# Patient Record
Sex: Female | Born: 1995 | Race: White | Hispanic: No | Marital: Single | Smoking: Never smoker
Health system: Southern US, Community
[De-identification: ages and names within clinical notes are randomized; demographics above are authoritative.]

## PROBLEM LIST (undated history)

## (undated) ENCOUNTER — Inpatient Hospital Stay (HOSPITAL_COMMUNITY): Payer: Self-pay

## (undated) DIAGNOSIS — Z789 Other specified health status: Secondary | ICD-10-CM

## (undated) HISTORY — PX: NO PAST SURGERIES: SHX2092

## (undated) HISTORY — DX: Other specified health status: Z78.9

---
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
## 2014-09-14 NOTE — H&P (Signed)
L&D Evaluation:  History:  HPI 19 year old G1 P0 with EDC=07/02/2013 by a 13 wk 1 day ultrasound presents to L&D at 63 6/7 weeks with c/o contractions since 0400 this AM that woke her up from sleep. Since arrival to hospital contractions have become more regular and more intense. Fetal heart rate baseline has been in the 160s with multiple accels since arrival. Denies VB. Had some LOF x 2 weeks-mostly mucoid discharge. Baby active. Denies N/V. PNC at [facility] remarkable for anemia (was on FE for a short period of time and takes PNV occasionally). LABS: O POS, RI, Varicella NI, GBS negative.TDAP given 12/22.   Presents with contractions   Patient's Medical History anemia   Patient's Surgical History none   Medications Pre Natal Vitamins  Mucinex   Allergies Amoxicillin   Social History none   Family History Non-Contributory   ROS:  ROS see HPI   Exam:  Vital Signs Pulse 124 initially, now 110. BP stable   Urine Protein not completed   General no apparent distress   Mental Status clear   Chest clear   Heart tachycardia with Grade I-II systolic murmur  at LSB.   Abdomen gravid, tender with contractions   Estimated Fetal Weight 6 1/2#   Fetal Position cephalic   Edema no edema   Pelvic no external lesions, perineum appears dry, cx 1.5/60-70%/-1 with BBOW (no change in 2 +hrs   Mebranes Intact   FHT baseline 160s with accels   FHT Description moderate variability   Ucx q2-3 min apart   Skin dry   Impression:  Impression IUP at 38 6/7 weeks in early labor. Fetal and maternal tachycardia.   Plan:  Plan EFM/NST, monitor contractions and for cervical change   Comments Mild Fetal tachycardia-no evidence at this point for chorio-will get CBC and GC/Chlamydia and monitor closely. Start IV and bolus incase there is some dehydration issues. Will begin augmentation and move toward delivery if no progress by 1200. Maternal tachycardia-check CBC for anemia. Maybe increased  due to anxiety/pain   Electronic Signatures: Karene Fry (CNM)  (Signed 18-Feb-15 11:04)  Authored: L&D Evaluation   Last Updated: 18-Feb-15 11:04 by Karene Fry (CNM)

## 2015-08-01 ENCOUNTER — Encounter: Payer: Self-pay | Admitting: Family Medicine

## 2015-08-01 ENCOUNTER — Ambulatory Visit (INDEPENDENT_AMBULATORY_CARE_PROVIDER_SITE_OTHER): Payer: Managed Care, Other (non HMO) | Admitting: Family Medicine

## 2015-08-01 VITALS — BP 80/52 | HR 91 | Temp 98.3°F | Ht 62.5 in | Wt 110.0 lb

## 2015-08-01 DIAGNOSIS — Z3202 Encounter for pregnancy test, result negative: Secondary | ICD-10-CM | POA: Diagnosis not present

## 2015-08-01 DIAGNOSIS — Z3009 Encounter for other general counseling and advice on contraception: Secondary | ICD-10-CM

## 2015-08-01 DIAGNOSIS — Z309 Encounter for contraceptive management, unspecified: Secondary | ICD-10-CM | POA: Diagnosis not present

## 2015-08-01 LAB — POCT URINE PREGNANCY: PREG TEST UR: NEGATIVE

## 2015-08-01 MED ORDER — NORGESTIM-ETH ESTRAD TRIPHASIC 0.18/0.215/0.25 MG-35 MCG PO TABS: 1.0000 | ORAL_TABLET | Freq: Every day | ORAL | Status: DC

## 2015-08-01 NOTE — Progress Notes (Signed)
Dr. Frederico Hamman T. [place], MD, [facility] [place], [address] Phone: [phone] Fax: [phone]  08/01/2015  Patient: Holly Wilson, MRN: [id number], DOB: Mar 30, 1996, 20 y.o.  Primary Physician:  Owens Loffler, MD   Chief Complaint  Patient presents with  . Medication Management    Wants to change from Birth Control Patch to a Pill   Subjective:   Holly Wilson is a 20 y.o. very pleasant female patient who presents with the following:  [name] - 20 years old.   Not used anything since the baby.   Prior to then she was using the birth control patch, and now she would like to go back on birth control pills, which she took when she was younger.  She is otherwise healthy and not having any problems.  HER-21-year-old is doing well.  Past Medical History, Surgical History, Social History, Family History, Problem List, Medications, and Allergies have been reviewed and updated if relevant.  Patient Active Problem List   Diagnosis Date Noted  . Contraception management 10/21/2012    Past Medical History  Diagnosis Date  . No pertinent past medical history     Past Surgical History  Procedure Laterality Date  . No past surgeries      Social History   Social History  . Marital Status: Single    Spouse Name: N/A  . Number of Children: N/A  . Years of Education: N/A   Occupational History  . Not on file.   Social History Main Topics  . Smoking status: Never Smoker   . Smokeless tobacco: Never Used  . Alcohol Use: No  . Drug Use: No  . Sexual Activity: Not on file   Other Topics Concern  . Not on file   Social History Narrative   Sexually active   Mom- Blanca Friend to play volleyball, softball and go to [employer] actively involved    No family history on file.  Allergies  Allergen Reactions  . Amoxicillin     REACTION: rash    Medication list reviewed and updated in full in [facility].   GEN: No acute illnesses, no fevers, chills. GI: No n/v/d, eating normally Pulm: No SOB Interactive and getting along well at home.  Otherwise, ROS is as per the HPI.  Objective:   BP 80/52 mmHg  Pulse 91  Temp(Src) 98.3 F (36.8 C) (Oral)  Ht 5' 2.5" (1.588 m)  Wt 110 lb (49.896 kg)  BMI 19.79 kg/m2  LMP 07/22/2015  GEN: WDWN, NAD, Non-toxic, A & O x 3 HEENT: Atraumatic, Normocephalic. Neck supple. No masses, No LAD. Ears and Nose: No external deformity. CV: RRR, No M/G/R. No JVD. No thrill. No extra heart sounds. PULM: CTA B, no wheezes, crackles, rhonchi. No retractions. No resp. distress. No accessory muscle use. EXTR: No c/c/e NEURO Normal gait.  PSYCH: Normally interactive. Conversant. Not depressed or anxious appearing.  Calm demeanor.   Laboratory and Imaging Data: Results for orders placed or performed in visit on 08/01/15  POCT urine pregnancy  Result Value Ref Range   Preg Test, Ur Negative Negative     Assessment and Plan:   Birth control counseling  Encounter for pregnancy test with result negative - Plan: POCT urine pregnancy  Doing well with 20 yo at home.  Start OCP's  Follow-up: prn  Modified Medications   Modified Medication Previous Medication   NORGESTIMATE-ETHINYL ESTRADIOL TRIPHASIC (  TRI-SPRINTEC) 0.18/0.215/0.25 MG-35 MCG TABLET TRI-SPRINTEC 0.18/0.215/0.25 MG-35 MCG tablet      Take 1 tablet by mouth daily.    TAKE 1 TABLET BY MOUTH EVERY DAY   Orders Placed This Encounter  Procedures  . POCT urine pregnancy    Signed,  Spencer T. [place], MD   Patient's Medications  New Prescriptions   No medications on file  Previous Medications   No medications on file  Modified Medications   Modified Medication Previous Medication   NORGESTIMATE-ETHINYL ESTRADIOL TRIPHASIC (TRI-SPRINTEC) 0.18/0.215/0.25 MG-35 MCG TABLET TRI-SPRINTEC 0.18/0.215/0.25 MG-35 MCG tablet      Take 1 tablet by mouth daily.    TAKE 1 TABLET BY  MOUTH EVERY DAY  Discontinued Medications   NORELGESTROMIN-ETHINYL ESTRADIOL (ORTHO EVRA) 150-35 MCG/24HR TRANSDERMAL PATCH    One patch weekly, change every week for 3 weeks then off x 1 week. Start Day 1 of menstrual cycle.

## 2015-08-01 NOTE — Progress Notes (Signed)
Pre visit review using our clinic review tool, if applicable. No additional management support is needed unless otherwise documented below in the visit note. 

## 2015-08-02 ENCOUNTER — Encounter: Payer: Self-pay | Admitting: Family Medicine

## 2015-08-02 ENCOUNTER — Telehealth: Payer: Self-pay | Admitting: Family Medicine

## 2015-08-02 NOTE — Telephone Encounter (Signed)
Left message for Shallyn to return my call.

## 2015-08-02 NOTE — Telephone Encounter (Signed)
Holly Wilson notified by telephone to use back up protection, like condoms, for the first month after starting OCPs.  Patient states understanding.

## 2015-08-02 NOTE — Telephone Encounter (Signed)
Would you mind calling patient: I forgot to remind her to use backup protection / condoms for a full month after starting birth control pills since they take some time before the hormonal contraception starts working.  Thanks.  Electronically Signed  By: Owens Loffler, MD On: 08/02/2015 10:44 AM

## 2015-12-07 ENCOUNTER — Ambulatory Visit: Payer: Managed Care, Other (non HMO) | Admitting: Family Medicine

## 2015-12-07 DIAGNOSIS — Z0289 Encounter for other administrative examinations: Secondary | ICD-10-CM

## 2016-05-07 NOTE — L&D Delivery Note (Signed)
Delivery Note At 2:18 AM a viable female was delivered via Vaginal, Spontaneous Delivery (Presentation: OA).  APGAR: 8,9 ; weight  pending.  Cord clamped and cut after 1 min pulsing.  One loose cord around the right shoulder, easily reduced.  Placenta status: delivered intact with gentle traction.  Cord: not collected.  Anesthesia:  Epidural Episiotomy: None Lacerations:  1st degree periurethral, hemostatic. Not repaired.  Suture Repair: na Est. Blood Loss (mL):  100  Mom to postpartum.  Baby to Couplet care / Skin to Skin.  Mervyn Skeeters Aslyn Cottman CNM 01/15/2017, 2:59 AM

## 2016-05-17 ENCOUNTER — Encounter: Payer: Self-pay | Admitting: Family Medicine

## 2016-05-17 ENCOUNTER — Ambulatory Visit (INDEPENDENT_AMBULATORY_CARE_PROVIDER_SITE_OTHER): Payer: Managed Care, Other (non HMO) | Admitting: Family Medicine

## 2016-05-17 VITALS — HR 92 | Wt 113.0 lb

## 2016-05-17 DIAGNOSIS — Z3201 Encounter for pregnancy test, result positive: Secondary | ICD-10-CM

## 2016-05-17 DIAGNOSIS — N912 Amenorrhea, unspecified: Secondary | ICD-10-CM | POA: Diagnosis not present

## 2016-05-17 LAB — POCT URINE PREGNANCY: Preg Test, Ur: POSITIVE — AB

## 2016-05-17 NOTE — Patient Instructions (Signed)
Please start a prenatal vitamin  First Trimester of Pregnancy The first trimester of pregnancy is from week 1 until the end of week 12 (months 1 through 3). A week after a sperm fertilizes an egg, the egg will implant on the wall of the uterus. This embryo will begin to develop into a baby. Genes from you and your partner are forming the baby. The female genes determine whether the baby is a boy or a girl. At 6-8 weeks, the eyes and face are formed, and the heartbeat can be seen on ultrasound. At the end of 12 weeks, all the baby's organs are formed.  Now that you are pregnant, you will want to do everything you can to have a healthy baby. Two of the most important things are to get good prenatal care and to follow your health care provider's instructions. Prenatal care is all the medical care you receive before the baby's birth. This care will help prevent, find, and treat any problems during the pregnancy and childbirth. BODY CHANGES Your body goes through many changes during pregnancy. The changes vary from woman to woman.   You may gain or lose a couple of pounds at first.  You may feel sick to your stomach (nauseous) and throw up (vomit). If the vomiting is uncontrollable, call your health care provider.  You may tire easily.  You may develop headaches that can be relieved by medicines approved by your health care provider.  You may urinate more often. Painful urination may mean you have a bladder infection.  You may develop heartburn as a result of your pregnancy.  You may develop constipation because certain hormones are causing the muscles that push waste through your intestines to slow down.  You may develop hemorrhoids or swollen, bulging veins (varicose veins).  Your breasts may begin to grow larger and become tender. Your nipples may stick out more, and the tissue that surrounds them (areola) may become darker.  Your gums may bleed and may be sensitive to brushing and  flossing.  Dark spots or blotches (chloasma, mask of pregnancy) may develop on your face. This will likely fade after the baby is born.  Your menstrual periods will stop.  You may have a loss of appetite.  You may develop cravings for certain kinds of food.  You may have changes in your emotions from day to day, such as being excited to be pregnant or being concerned that something may go wrong with the pregnancy and baby.  You may have more vivid and strange dreams.  You may have changes in your hair. These can include thickening of your hair, rapid growth, and changes in texture. Some women also have hair loss during or after pregnancy, or hair that feels dry or thin. Your hair will most likely return to normal after your baby is born. WHAT TO EXPECT AT YOUR PRENATAL VISITS During a routine prenatal visit:  You will be weighed to make sure you and the baby are growing normally.  Your blood pressure will be taken.  Your abdomen will be measured to track your baby's growth.  The fetal heartbeat will be listened to starting around week 10 or 12 of your pregnancy.  Test results from any previous visits will be discussed. Your health care provider may ask you:  How you are feeling.  If you are feeling the baby move.  If you have had any abnormal symptoms, such as leaking fluid, bleeding, severe headaches, or abdominal cramping.  If  you are using any tobacco products, including cigarettes, chewing tobacco, and electronic cigarettes.  If you have any questions. Other tests that may be performed during your first trimester include:  Blood tests to find your blood type and to check for the presence of any previous infections. They will also be used to check for low iron levels (anemia) and Rh antibodies. Later in the pregnancy, blood tests for diabetes will be done along with other tests if problems develop.  Urine tests to check for infections, diabetes, or protein in the  urine.  An ultrasound to confirm the proper growth and development of the baby.  An amniocentesis to check for possible genetic problems.  Fetal screens for spina bifida and Down syndrome.  You may need other tests to make sure you and the baby are doing well.  HIV (human immunodeficiency virus) testing. Routine prenatal testing includes screening for HIV, unless you choose not to have this test. HOME CARE INSTRUCTIONS  Medicines   Follow your health care provider's instructions regarding medicine use. Specific medicines may be either safe or unsafe to take during pregnancy.  Take your prenatal vitamins as directed.  If you develop constipation, try taking a stool softener if your health care provider approves. Diet   Eat regular, well-balanced meals. Choose a variety of foods, such as meat or vegetable-based protein, fish, milk and low-fat dairy products, vegetables, fruits, and whole grain breads and cereals. Your health care provider will help you determine the amount of weight gain that is right for you.  Avoid raw meat and uncooked cheese. These carry germs that can cause birth defects in the baby.  Eating four or five small meals rather than three large meals a day may help relieve nausea and vomiting. If you start to feel nauseous, eating a few soda crackers can be helpful. Drinking liquids between meals instead of during meals also seems to help nausea and vomiting.  If you develop constipation, eat more high-fiber foods, such as fresh vegetables or fruit and whole grains. Drink enough fluids to keep your urine clear or pale yellow. Activity and Exercise   Exercise only as directed by your health care provider. Exercising will help you:  Control your weight.  Stay in shape.  Be prepared for labor and delivery.  Experiencing pain or cramping in the lower abdomen or low back is a good sign that you should stop exercising. Check with your health care provider before  continuing normal exercises.  Try to avoid standing for long periods of time. Move your legs often if you must stand in one place for a long time.  Avoid heavy lifting.  Wear low-heeled shoes, and practice good posture.  You may continue to have sex unless your health care provider directs you otherwise. Relief of Pain or Discomfort   Wear a good support bra for breast tenderness.   Take warm sitz baths to soothe any pain or discomfort caused by hemorrhoids. Use hemorrhoid cream if your health care provider approves.   Rest with your legs elevated if you have leg cramps or low back pain.  If you develop varicose veins in your legs, wear support hose. Elevate your feet for 15 minutes, 3-4 times a day. Limit salt in your diet. Prenatal Care   Schedule your prenatal visits by the twelfth week of pregnancy. They are usually scheduled monthly at first, then more often in the last 2 months before delivery.  Write down your questions. Take them to your prenatal  visits.  Keep all your prenatal visits as directed by your health care provider. Safety   Wear your seat belt at all times when driving.  Make a list of emergency phone numbers, including numbers for family, friends, the hospital, and police and fire departments. General Tips   Ask your health care provider for a referral to a local prenatal education class. Begin classes no later than at the beginning of month 6 of your pregnancy.  Ask for help if you have counseling or nutritional needs during pregnancy. Your health care provider can offer advice or refer you to specialists for help with various needs.  Do not use hot tubs, steam rooms, or saunas.  Do not douche or use tampons or scented sanitary pads.  Do not cross your legs for long periods of time.  Avoid cat litter boxes and soil used by cats. These carry germs that can cause birth defects in the baby and possibly loss of the fetus by miscarriage or  stillbirth.  Avoid all smoking, herbs, alcohol, and medicines not prescribed by your health care provider. Chemicals in these affect the formation and growth of the baby.  Do not use any tobacco products, including cigarettes, chewing tobacco, and electronic cigarettes. If you need help quitting, ask your health care provider. You may receive counseling support and other resources to help you quit.  Schedule a dentist appointment. At home, brush your teeth with a soft toothbrush and be gentle when you floss. SEEK MEDICAL CARE IF:   You have dizziness.  You have mild pelvic cramps, pelvic pressure, or nagging pain in the abdominal area.  You have persistent nausea, vomiting, or diarrhea.  You have a bad smelling vaginal discharge.  You have pain with urination.  You notice increased swelling in your face, hands, legs, or ankles. SEEK IMMEDIATE MEDICAL CARE IF:   You have a fever.  You are leaking fluid from your vagina.  You have spotting or bleeding from your vagina.  You have severe abdominal cramping or pain.  You have rapid weight gain or loss.  You vomit blood or material that looks like coffee grounds.  You are exposed to [place] measles and have never had them.  You are exposed to fifth disease or chickenpox.  You develop a severe headache.  You have shortness of breath.  You have any kind of trauma, such as from a fall or a car accident. This information is not intended to replace advice given to you by your health care provider. Make sure you discuss any questions you have with your health care provider. Document Released: 04/17/2001 Document Revised: 05/14/2014 Document Reviewed: 03/03/2013 Elsevier Interactive Patient Education  2017 [employer].

## 2016-05-17 NOTE — Progress Notes (Signed)
   Subjective:    Patient ID: Holly Wilson, female    DOB: August 19, 1995, 21 y.o.   MRN: [id number]  HPI This is a 21 yo female who presents today with amenorrhea and positive home pregnancy test. LMP 03/31/16. Has had some breast tenderness and bloating. No nausea/vomiting or fatigue. Had stopped OCPs several months ago because they made her feel more hormonal. Does not take any medications, no alcohol or drug use. Has 63 yo daughter at home.   Past Medical History:  Diagnosis Date  . No pertinent past medical history    Past Surgical History:  Procedure Laterality Date  . NO PAST SURGERIES     No family history on file. Social History  Substance Use Topics  . Smoking status: Never Smoker  . Smokeless tobacco: Never Used  . Alcohol use No      Review of Systems Per HPI    Objective:   Physical Exam Physical Exam  Vitals reviewed. Constitutional: Oriented to person, place, and time. Appears well-developed and well-nourished.  HENT:  Head: Normocephalic and atraumatic.  Eyes: Conjunctivae are normal.  Neck: Normal range of motion. Neck supple.  Cardiovascular: Normal rate.   Pulmonary/Chest: Effort normal.  Musculoskeletal: Normal range of motion.  Neurological: Alert and oriented to person, place, and time.  Skin: Skin is warm and dry.  Psychiatric: Normal mood and affect. Behavior is normal. Judgment and thought content normal.   Pulse 92   Wt 113 lb (51.3 kg)   LMP 03/31/2016   SpO2 99%   BMI 20.34 kg/m  Wt Readings from Last 3 Encounters:  05/17/16 113 lb (51.3 kg)  08/01/15 110 lb (49.9 kg)  11/24/12 115 lb (52.2 kg) (30 %, Z= -0.53)*   * Growth percentiles are based on CDC 2-20 Years data.   Results for orders placed or performed in visit on 05/17/16  POCT urine pregnancy  Result Value Ref Range   Preg Test, Ur Positive (A) Negative       Assessment & Plan:  1. Amenorrhea - POCT urine pregnancy  2. Positive pregnancy test - instructed her to start  prenatal vitamin - provided written and verbal information regarding first trimester of pregnancy - Ambulatory referral to Obstetrics / Gynecology  Clarene Reamer, FNP-BC  [facility] at [facility], [place]  05/17/2016 9:24 AM

## 2016-06-05 ENCOUNTER — Telehealth: Payer: Self-pay

## 2016-06-05 NOTE — Telephone Encounter (Signed)
Pt left v/m; pt seen 05/17/16 and is pregnant; pt has been vomiting daily for 2 1/2 weeks and pt wants to know what she can take for vomiting. Pt has appt with OB on 06/18/16. Pt request cb. [facility].

## 2016-06-06 MED ORDER — DOXYLAMINE-PYRIDOXINE 10-10 MG PO TBEC
DELAYED_RELEASE_TABLET | ORAL | 1 refills | Status: DC
Start: 2016-06-06 — End: 2016-06-18

## 2016-06-06 NOTE — Telephone Encounter (Signed)
1st - congratulations!  Most of the OB's are using Diclegis now for nausea in pregnancy.  Please call in:  Diclegis 1 tab po QAM and 1-2 tabs po QHS prn nausea.  Disp: 50 Refills: 1  Hope she feels better

## 2016-06-06 NOTE — Telephone Encounter (Signed)
Rx sent is as instructed by Dr. Lorelei Pont.  Left message for [place] to return my call.

## 2016-06-06 NOTE — Telephone Encounter (Signed)
Left message for Holly Wilson that a prescription for nausea has been sent into her pharmacy.  Advised to call back if she has any questions.

## 2016-06-18 ENCOUNTER — Other Ambulatory Visit (HOSPITAL_COMMUNITY)
Admission: RE | Admit: 2016-06-18 | Discharge: 2016-06-18 | Disposition: A | Discharge status: Home / Self Care | Payer: Managed Care, Other (non HMO) | Source: Ambulatory Visit | Attending: Obstetrics & Gynecology | Admitting: Obstetrics & Gynecology

## 2016-06-18 ENCOUNTER — Encounter: Payer: Self-pay | Admitting: Obstetrics & Gynecology

## 2016-06-18 ENCOUNTER — Ambulatory Visit (INDEPENDENT_AMBULATORY_CARE_PROVIDER_SITE_OTHER): Payer: Managed Care, Other (non HMO) | Admitting: Obstetrics & Gynecology

## 2016-06-18 VITALS — BP 95/62 | HR 109 | Ht 63.0 in | Wt 116.0 lb

## 2016-06-18 DIAGNOSIS — Z3491 Encounter for supervision of normal pregnancy, unspecified, first trimester: Secondary | ICD-10-CM | POA: Diagnosis not present

## 2016-06-18 DIAGNOSIS — Z113 Encounter for screening for infections with a predominantly sexual mode of transmission: Secondary | ICD-10-CM | POA: Diagnosis not present

## 2016-06-18 DIAGNOSIS — Z3689 Encounter for other specified antenatal screening: Secondary | ICD-10-CM

## 2016-06-18 DIAGNOSIS — Z124 Encounter for screening for malignant neoplasm of cervix: Secondary | ICD-10-CM

## 2016-06-18 DIAGNOSIS — Z3490 Encounter for supervision of normal pregnancy, unspecified, unspecified trimester: Secondary | ICD-10-CM

## 2016-06-18 DIAGNOSIS — Z01419 Encounter for gynecological examination (general) (routine) without abnormal findings: Secondary | ICD-10-CM | POA: Diagnosis present

## 2016-06-18 MED ORDER — DOXYLAMINE-PYRIDOXINE 10-10 MG PO TBEC: DELAYED_RELEASE_TABLET | ORAL | 2 refills | Status: DC

## 2016-06-18 NOTE — Progress Notes (Signed)
[place] Bedside shows single IUP with FHR 180bpm and CRL measuring [redacted]w[redacted]d

## 2016-06-18 NOTE — Progress Notes (Signed)
Subjective:   Lareina Oquin is a 21 y.o. G2P1001 at [redacted]w[redacted]d by early ultrasound not c/w LMP being seen today for her first obstetrical visit.  Her obstetrical history is significant for term SVD. Patient does intend to breast feed. Pregnancy history fully reviewed.  Patient reports nausea and vomiting.  HISTORY: Obstetric History   G2   P1   T1   P0   A0   L1    SAB0   TAB0   Ectopic0   Multiple0   Live Births1     # Outcome Date GA Lbr Len/2nd Weight Sex Delivery Anes PTL Lv  2 Current           1 Term 06/25/13 [redacted]w[redacted]d   F Vag-Spont EPI  LIV    Obstetric Comments  Fetal and maternal tachycardia during labor   Past Medical History:  Diagnosis Date  . No pertinent past medical history    Past Surgical History:  Procedure Laterality Date  . NO PAST SURGERIES     Family History  Problem Relation Age of Onset  . Breast cancer Maternal Grandmother 69   Social History  Substance Use Topics  . Smoking status: Never Smoker  . Smokeless tobacco: Never Used  . Alcohol use No   Allergies  Allergen Reactions  . Amoxicillin     REACTION: rash   No current outpatient prescriptions on file prior to visit.   No current facility-administered medications on file prior to visit.      Exam   Vitals:   06/18/16 1324 06/18/16 1329  BP: 95/62   Pulse: (!) 109   Weight: 116 lb (52.6 kg)   Height:  5\' 3"  (1.6 m)   Fetal Heart Rate (bpm): 175US  Uterus:     Pelvic Exam: Perineum: no hemorrhoids, normal perineum   Vulva: normal external genitalia, no lesions   Vagina:  normal mucosa, normal discharge   Cervix: no lesions and normal, pap smear done.    Adnexa: normal adnexa and no mass, fullness, tenderness   Bony Pelvis: average  System: General: well-developed, well-nourished female in no acute distress   Breast:  normal appearance, no masses or tenderness   Skin: normal coloration and turgor, no rashes   Neurologic: oriented, normal, negative, normal mood   Extremities:  normal strength, tone, and muscle mass, ROM of all joints is normal   HEENT PERRLA, extraocular movement intact and sclera clear, anicteric   Mouth/Teeth mucous membranes moist, pharynx normal without lesions and dental hygiene good   Neck supple and no masses   Cardiovascular: regular rate and rhythm   Respiratory:  no respiratory distress, normal breath sounds   Abdomen: soft, non-tender; bowel sounds normal; no masses,  no organomegaly     Assessment:   Pregnancy: G2P1001 Patient Active Problem List   Diagnosis Date Noted  . Supervision of normal pregnancy, antepartum 06/18/2016     Plan:  1. Encounter for supervision of normal pregnancy, antepartum, unspecified gravidity Patient enrolled in 58.  Diclegis given for nausea. - Culture, OB Urine - GC/Chlamydia probe amp ([place])not at [facility] - Obstetric Panel, Including HIV - Cytology - PAP - [place] bedside; Future - [place] MFM Fetal Nuchal Translucency; Future - Doxylamine-Pyridoxine (DICLEGIS) 10-10 MG TBEC; Take 2 tab qhs, add 2 midday if Sx persist  Dispense: 60 tablet; Refill: 2 - ToxASSURE Select 13 (MW), Urine  Initial labs drawn. Continue prenatal vitamins. Genetic Screening discussed, First trimester screen: ordered. Ultrasound discussed; fetal anatomic  survey: to be ordered later. Problem list reviewed and updated. The nature of [facility] with multiple MDs and other Advanced Practice Providers was explained to patient; also emphasized that residents, students are part of our team. Routine obstetric precautions reviewed. Return in about 3 weeks (around 07/09/2016) for OB Visit (Babyscripts 12 weeks).     Verita Schneiders, MD, [place] Attending [place], [facility] for [employer], [place]

## 2016-06-18 NOTE — Patient Instructions (Signed)
First Trimester of Pregnancy The first trimester of pregnancy is from week 1 until the end of week 12 (months 1 through 3). A week after a sperm fertilizes an egg, the egg will implant on the wall of the uterus. This embryo will begin to develop into a baby. Genes from you and your partner are forming the baby. The female genes determine whether the baby is a boy or a girl. At 6-8 weeks, the eyes and face are formed, and the heartbeat can be seen on ultrasound. At the end of 12 weeks, all the baby's organs are formed.  Now that you are pregnant, you will want to do everything you can to have a healthy baby. Two of the most important things are to get good prenatal care and to follow your health care provider's instructions. Prenatal care is all the medical care you receive before the baby's birth. This care will help prevent, find, and treat any problems during the pregnancy and childbirth. BODY CHANGES Your body goes through many changes during pregnancy. The changes vary from woman to woman.   You may gain or lose a couple of pounds at first.  You may feel sick to your stomach (nauseous) and throw up (vomit). If the vomiting is uncontrollable, call your health care provider.  You may tire easily.  You may develop headaches that can be relieved by medicines approved by your health care provider.  You may urinate more often. Painful urination may mean you have a bladder infection.  You may develop heartburn as a result of your pregnancy.  You may develop constipation because certain hormones are causing the muscles that push waste through your intestines to slow down.  You may develop hemorrhoids or swollen, bulging veins (varicose veins).  Your breasts may begin to grow larger and become tender. Your nipples may stick out more, and the tissue that surrounds them (areola) may become darker.  Your gums may bleed and may be sensitive to brushing and flossing.  Dark spots or blotches (chloasma,  mask of pregnancy) may develop on your face. This will likely fade after the baby is born.  Your menstrual periods will stop.  You may have a loss of appetite.  You may develop cravings for certain kinds of food.  You may have changes in your emotions from day to day, such as being excited to be pregnant or being concerned that something may go wrong with the pregnancy and baby.  You may have more vivid and strange dreams.  You may have changes in your hair. These can include thickening of your hair, rapid growth, and changes in texture. Some women also have hair loss during or after pregnancy, or hair that feels dry or thin. Your hair will most likely return to normal after your baby is born. WHAT TO EXPECT AT YOUR PRENATAL VISITS During a routine prenatal visit:  You will be weighed to make sure you and the baby are growing normally.  Your blood pressure will be taken.  Your abdomen will be measured to track your baby's growth.  The fetal heartbeat will be listened to starting around week 10 or 12 of your pregnancy.  Test results from any previous visits will be discussed. Your health care provider may ask you:  How you are feeling.  If you are feeling the baby move.  If you have had any abnormal symptoms, such as leaking fluid, bleeding, severe headaches, or abdominal cramping.  If you are using any tobacco products,   including cigarettes, chewing tobacco, and electronic cigarettes.  If you have any questions. Other tests that may be performed during your first trimester include:  Blood tests to find your blood type and to check for the presence of any previous infections. They will also be used to check for low iron levels (anemia) and Rh antibodies. Later in the pregnancy, blood tests for diabetes will be done along with other tests if problems develop.  Urine tests to check for infections, diabetes, or protein in the urine.  An ultrasound to confirm the proper growth  and development of the baby.  An amniocentesis to check for possible genetic problems.  Fetal screens for spina bifida and Down syndrome.  You may need other tests to make sure you and the baby are doing well.  HIV (human immunodeficiency virus) testing. Routine prenatal testing includes screening for HIV, unless you choose not to have this test. HOME CARE INSTRUCTIONS  Medicines   Follow your health care provider's instructions regarding medicine use. Specific medicines may be either safe or unsafe to take during pregnancy.  Take your prenatal vitamins as directed.  If you develop constipation, try taking a stool softener if your health care provider approves. Diet   Eat regular, well-balanced meals. Choose a variety of foods, such as meat or vegetable-based protein, fish, milk and low-fat dairy products, vegetables, fruits, and whole grain breads and cereals. Your health care provider will help you determine the amount of weight gain that is right for you.  Avoid raw meat and uncooked cheese. These carry germs that can cause birth defects in the baby.  Eating four or five small meals rather than three large meals a day may help relieve nausea and vomiting. If you start to feel nauseous, eating a few soda crackers can be helpful. Drinking liquids between meals instead of during meals also seems to help nausea and vomiting.  If you develop constipation, eat more high-fiber foods, such as fresh vegetables or fruit and whole grains. Drink enough fluids to keep your urine clear or pale yellow. Activity and Exercise   Exercise only as directed by your health care provider. Exercising will help you:  Control your weight.  Stay in shape.  Be prepared for labor and delivery.  Experiencing pain or cramping in the lower abdomen or low back is a good sign that you should stop exercising. Check with your health care provider before continuing normal exercises.  Try to avoid standing for  long periods of time. Move your legs often if you must stand in one place for a long time.  Avoid heavy lifting.  Wear low-heeled shoes, and practice good posture.  You may continue to have sex unless your health care provider directs you otherwise. Relief of Pain or Discomfort   Wear a good support bra for breast tenderness.   Take warm sitz baths to soothe any pain or discomfort caused by hemorrhoids. Use hemorrhoid cream if your health care provider approves.   Rest with your legs elevated if you have leg cramps or low back pain.  If you develop varicose veins in your legs, wear support hose. Elevate your feet for 15 minutes, 3-4 times a day. Limit salt in your diet. Prenatal Care   Schedule your prenatal visits by the twelfth week of pregnancy. They are usually scheduled monthly at first, then more often in the last 2 months before delivery.  Write down your questions. Take them to your prenatal visits.  Keep all your prenatal  legs, wear support hose. Elevate your feet for 15 minutes, 3-4 times a day. Limit salt in your diet.  Prenatal Care  · Schedule your prenatal visits by the twelfth week of pregnancy. They are usually scheduled monthly at first, then more often in the last 2 months before delivery.  · Write down your questions. Take them to your prenatal visits.  · Keep all your prenatal visits as directed by your health care provider.  Safety  · Wear your seat belt at all times when driving.  · Make a list of emergency phone numbers, including numbers for family, friends, the hospital, and police and fire departments.  General Tips  · Ask your health care provider for a referral to a local prenatal education class. Begin classes no later than at the beginning of month 6 of your pregnancy.  · Ask for help if you have counseling or nutritional needs during pregnancy. Your health care provider can offer advice or refer you to specialists for help with various needs.  · Do not use hot tubs, steam rooms, or saunas.  · Do not douche or use tampons or scented sanitary pads.  · Do not cross your legs for long periods of time.  · Avoid cat litter boxes and soil used by cats. These carry germs that can cause birth defects in the baby and possibly loss of the fetus by miscarriage or stillbirth.   · Avoid all smoking, herbs, alcohol, and medicines not prescribed by your health care provider. Chemicals in these affect the formation and growth of the baby.  · Do not use any tobacco products, including cigarettes, chewing tobacco, and electronic cigarettes. If you need help quitting, ask your health care provider. You may receive counseling support and other resources to help you quit.  · Schedule a dentist appointment. At home, brush your teeth with a soft toothbrush and be gentle when you floss.  SEEK MEDICAL CARE IF:   · You have dizziness.  · You have mild pelvic cramps, pelvic pressure, or nagging pain in the abdominal area.  · You have persistent nausea, vomiting, or diarrhea.  · You have a bad smelling vaginal discharge.  · You have pain with urination.  · You notice increased swelling in your face, hands, legs, or ankles.  SEEK IMMEDIATE MEDICAL CARE IF:   · You have a fever.  · You are leaking fluid from your vagina.  · You have spotting or bleeding from your vagina.  · You have severe abdominal cramping or pain.  · You have rapid weight gain or loss.  · You vomit blood or material that looks like coffee grounds.  · You are exposed to German measles and have never had them.  · You are exposed to fifth disease or chickenpox.  · You develop a severe headache.  · You have shortness of breath.  · You have any kind of trauma, such as from a fall or a car accident.     This information is not intended to replace advice given to you by your health care provider. Make sure you discuss any questions you have with your health care provider.     Document Released: 04/17/2001 Document Revised: 05/14/2014 Document Reviewed: 03/03/2013  Elsevier Interactive Patient Education ©2017 [employer]

## 2016-06-19 LAB — OBSTETRIC PANEL, INCLUDING HIV
HIV Screen 4th Generation wRfx: NONREACTIVE
Hepatitis B Surface Ag: NEGATIVE
RPR Ser Ql: NONREACTIVE
Rubella Antibodies, IGG: 11.2 index (ref >0.99)

## 2016-06-19 LAB — GC/CHLAMYDIA PROBE AMP (~~LOC~~) NOT AT ARMC
Chlamydia: NEGATIVE
NEISSERIA GONORRHEA: NEGATIVE

## 2016-06-19 LAB — CYTOLOGY - PAP: DIAGNOSIS: NEGATIVE

## 2016-06-26 LAB — CULTURE, OB URINE

## 2016-06-26 LAB — TOXASSURE SELECT 13 (MW), URINE

## 2016-06-26 LAB — URINE CULTURE, OB REFLEX

## 2016-06-28 NOTE — Progress Notes (Signed)
Lavender tops were not sent to lab, will repeat her labs at next visit. Thank you.

## 2016-07-05 ENCOUNTER — Encounter (HOSPITAL_COMMUNITY): Payer: Self-pay | Admitting: Obstetrics & Gynecology

## 2016-07-09 ENCOUNTER — Encounter: Payer: Managed Care, Other (non HMO) | Admitting: Obstetrics & Gynecology

## 2016-07-16 ENCOUNTER — Other Ambulatory Visit: Payer: Self-pay

## 2016-07-16 ENCOUNTER — Ambulatory Visit (HOSPITAL_COMMUNITY)
Admission: RE | Admit: 2016-07-16 | Discharge: 2016-07-16 | Disposition: A | Discharge status: Home / Self Care | Payer: Managed Care, Other (non HMO) | Source: Ambulatory Visit | Attending: Obstetrics & Gynecology | Admitting: Obstetrics & Gynecology

## 2016-07-16 ENCOUNTER — Encounter (HOSPITAL_COMMUNITY): Payer: Self-pay

## 2016-07-16 DIAGNOSIS — Z3A13 13 weeks gestation of pregnancy: Secondary | ICD-10-CM | POA: Diagnosis not present

## 2016-07-16 DIAGNOSIS — Z3682 Encounter for antenatal screening for nuchal translucency: Secondary | ICD-10-CM | POA: Diagnosis not present

## 2016-07-16 DIAGNOSIS — Z3490 Encounter for supervision of normal pregnancy, unspecified, unspecified trimester: Secondary | ICD-10-CM

## 2016-07-19 ENCOUNTER — Emergency Department
Admission: EM | Admit: 2016-07-19 | Discharge: 2016-07-19 | Disposition: A | Discharge status: Home / Self Care | Payer: Managed Care, Other (non HMO) | Attending: Emergency Medicine | Admitting: Emergency Medicine

## 2016-07-19 ENCOUNTER — Encounter: Payer: Self-pay | Admitting: Emergency Medicine

## 2016-07-19 DIAGNOSIS — R197 Diarrhea, unspecified: Secondary | ICD-10-CM | POA: Insufficient documentation

## 2016-07-19 DIAGNOSIS — O219 Vomiting of pregnancy, unspecified: Secondary | ICD-10-CM | POA: Diagnosis not present

## 2016-07-19 DIAGNOSIS — Z3A13 13 weeks gestation of pregnancy: Secondary | ICD-10-CM | POA: Insufficient documentation

## 2016-07-19 DIAGNOSIS — R112 Nausea with vomiting, unspecified: Secondary | ICD-10-CM

## 2016-07-19 DIAGNOSIS — O26891 Other specified pregnancy related conditions, first trimester: Secondary | ICD-10-CM | POA: Diagnosis present

## 2016-07-19 LAB — COMPREHENSIVE METABOLIC PANEL
ALBUMIN: 4.5 g/dL (ref 3.5–5.0)
ALK PHOS: 55 U/L (ref 38–126)
ALT: 13 U/L — ABNORMAL LOW (ref 14–54)
ANION GAP: 11 (ref 5–15)
AST: 21 U/L (ref 15–41)
BUN: 11 mg/dL (ref 6–20)
CALCIUM: 9.6 mg/dL (ref 8.9–10.3)
CO2: 22 mmol/L (ref 22–32)
Chloride: 102 mmol/L (ref 101–111)
Creatinine, Ser: 0.52 mg/dL (ref 0.44–1.00)
GFR calc Af Amer: >60 mL/min (ref >60)
GFR calc non Af Amer: >60 mL/min (ref >60)
GLUCOSE: 91 mg/dL (ref 65–99)
POTASSIUM: 3.6 mmol/L (ref 3.5–5.1)
SODIUM: 135 mmol/L (ref 135–145)
Total Bilirubin: 0.6 mg/dL (ref 0.3–1.2)
Total Protein: 7.8 g/dL (ref 6.5–8.1)

## 2016-07-19 LAB — HCG, QUANTITATIVE, PREGNANCY: hCG, Beta Chain, Quant, S: 87058 m[IU]/mL — ABNORMAL HIGH (ref <5)

## 2016-07-19 LAB — CBC
HEMATOCRIT: 39.2 % (ref 35.0–47.0)
HEMOGLOBIN: 13.7 g/dL (ref 12.0–16.0)
MCH: 33.2 pg (ref 26.0–34.0)
MCHC: 34.9 g/dL (ref 32.0–36.0)
MCV: 95 fL (ref 80.0–100.0)
Platelets: 208 10*3/uL (ref 150–440)
RBC: 4.13 MIL/uL (ref 3.80–5.20)
RDW: 13.5 % (ref 11.5–14.5)
WBC: 10.9 10*3/uL (ref 3.6–11.0)

## 2016-07-19 LAB — LIPASE, BLOOD: Lipase: 19 U/L (ref 11–51)

## 2016-07-19 MED ORDER — ONDANSETRON 4 MG PO TBDP
4.0000 mg | ORAL_TABLET | Freq: Once | ORAL | Status: AC
  Administered 2016-07-19: 4 mg via ORAL

## 2016-07-19 MED ORDER — ONDANSETRON 4 MG PO TBDP: 4.0000 mg | ORAL_TABLET | Freq: Three times a day (TID) | ORAL | 0 refills | Status: DC | PRN

## 2016-07-19 MED ORDER — ONDANSETRON HCL 4 MG/2ML IJ SOLN
4.0000 mg | Freq: Once | INTRAMUSCULAR | Status: AC
  Administered 2016-07-19: 4 mg via INTRAVENOUS
  Filled 2016-07-19: qty 2

## 2016-07-19 MED ORDER — ONDANSETRON HCL 4 MG PO TABS: 4.0000 mg | ORAL_TABLET | Freq: Once | ORAL | Status: DC

## 2016-07-19 MED ORDER — SODIUM CHLORIDE 0.9 % IV BOLUS (SEPSIS)
1000.0000 mL | Freq: Once | INTRAVENOUS | Status: AC
  Administered 2016-07-19: 1000 mL via INTRAVENOUS

## 2016-07-19 MED ORDER — ONDANSETRON 4 MG PO TBDP
ORAL_TABLET | ORAL | Status: AC
  Administered 2016-07-19: 4 mg via ORAL
  Filled 2016-07-19: qty 1

## 2016-07-19 NOTE — ED Provider Notes (Signed)
[facility] Emergency Department Provider Note  Time seen: 9:36 PM  I have reviewed the triage vital signs and the nursing notes.   HISTORY  Chief Complaint Emesis and Diarrhea    HPI Holly Wilson is a 21 y.o. female G2 P1 [redacted] weeks pregnant who presents the emergency department nausea or vomiting. According to the patient since this morning she isn't very nauseated and has not been able to keep anything down due to vomiting. Just a states a few episodes of diarrhea intermittently over the last few days. Patient states a history of morning sickness for which she is prescribed diclegis by her OB/GYN. Patient states she tried to take it but is vomiting and cannot keep anything down. Denies any abdominal pain. Denies any cramping. Denies vaginal bleeding or discharge. Denies any history of morning sickness with her prior pregnancy.  Past Medical History:  Diagnosis Date  . No pertinent past medical history     Patient Active Problem List   Diagnosis Date Noted  . Supervision of normal pregnancy, antepartum 06/18/2016    Past Surgical History:  Procedure Laterality Date  . NO PAST SURGERIES      Prior to Admission medications   Medication Sig Start Date End Date Taking? Authorizing Provider  Doxylamine-Pyridoxine (DICLEGIS) 10-10 MG TBEC Take 2 tab qhs, add 2 midday if Sx persist 06/18/16   Osborne Oman, MD  Prenatal Vit-Fe Fumarate-FA (PRENATAL MULTIVITAMIN) TABS tablet Take 1 tablet by mouth daily at 12 noon.    Historical Provider, MD    Allergies  Allergen Reactions  . Amoxicillin     REACTION: rash    Family History  Problem Relation Age of Onset  . Breast cancer Maternal Grandmother 52    Social History Social History  Substance Use Topics  . Smoking status: Never Smoker  . Smokeless tobacco: Never Used  . Alcohol use No    Review of Systems Constitutional: Negative for fever. Cardiovascular: Negative for chest pain. Respiratory:  Negative for shortness of breath. Gastrointestinal: Negative for abdominal pain. Positive for vomiting. Positive for diarrhea. Genitourinary: Negative for dysuria Neurological: Negative for headache 10-point ROS otherwise negative.  ____________________________________________   PHYSICAL EXAM:  VITAL SIGNS: ED Triage Vitals  Enc Vitals Group     BP 07/19/16 1944 139/72     Pulse Rate 07/19/16 1944 (!) 107     Resp 07/19/16 1944 18     Temp 07/19/16 1944 98.8 F (37.1 C)     Temp Source 07/19/16 1944 Oral     SpO2 07/19/16 1944 100 %     Weight 07/19/16 1944 114 lb (51.7 kg)     Height 07/19/16 1944 5\' 3"  (1.6 m)     Head Circumference --      Peak Flow --      Pain Score 07/19/16 1946 0     Pain Loc --      Pain Edu? --      Excl. in [place]? --     Constitutional: Alert and oriented. Actively vomiting in the emergency department. Eyes: Normal exam ENT   Head: Normocephalic and atraumatic.   Mouth/Throat: Mucous membranes are moist. Cardiovascular: Normal rate, regular rhythm. No murmur Respiratory: Normal respiratory effort without tachypnea nor retractions. Breath sounds are clear  Gastrointestinal: Soft and nontender. No distention.   Musculoskeletal: Nontender with normal range of motion in all extremities.  Neurologic:  Normal speech and language. No gross focal neurologic deficits  Skin:  Skin is warm,  dry and intact.  Psychiatric: Mood and affect are normal.     INITIAL IMPRESSION / ASSESSMENT AND PLAN / ED COURSE  Pertinent labs & imaging results that were available during my care of the patient were reviewed by me and considered in my medical decision making (see chart for details).  Patient presents to the emergency department nausea and vomiting. Patient states she has had issues with nausea and vomiting throughout the pregnancy, her symptoms are much worse today. Patient's first and [redacted] weeks pregnant. Denies any abdominal pain and vaginal bleeding or  discharge. Patient's labs are largely within normal limits including normal white blood cell count. Normal anion gap. Urinalysis is pending. Beta hCG is appropriately elevated. Patient states intermittent diarrhea throughout the pregnancy as well, it is unclear if this is due to an acute gastroenteritis versus more likely hyperemesis gravidarum. I discussed the pros and cons of Zofran, including cleft lip/cleft palate concerns. Patient is agreeable to use Zofran.  Patient states she is feeling much better after Zofran. Patient able to tolerate water without issue. We will discharge with ODT Zofran. Patient will follow up with her OB/GYN. ____________________________________________   FINAL CLINICAL IMPRESSION(S) / ED DIAGNOSES  Nausea vomiting diarrhea    Harvest Dark, MD 07/19/16 2234

## 2016-07-19 NOTE — ED Triage Notes (Signed)
PT ambulatory to triage in NAD, report n/v/d x 9 weeks, OB aware and currently taking diclegis w/o relief.  G2P1.  Pt denies morning sickness w/ previous pregnancy

## 2016-07-19 NOTE — ED Notes (Signed)
Pt able to tolerate PO fluids at this time.  

## 2016-07-20 ENCOUNTER — Telehealth: Payer: Self-pay | Admitting: Family Medicine

## 2016-07-20 ENCOUNTER — Telehealth: Payer: Self-pay

## 2016-07-20 NOTE — Telephone Encounter (Signed)
I am happy to see her - but better if GYN sees her.

## 2016-07-20 NOTE — Telephone Encounter (Signed)
Left detailed msg on VM per HIPAA  

## 2016-07-20 NOTE — Telephone Encounter (Signed)
Pt has appt with Holly Wilson on Monday--- I called pt [facility] to see if she has a GYN or have called them to see if she may need to f/u with them as well---please advise

## 2016-07-20 NOTE — Telephone Encounter (Signed)
[place] [facility]  Patient Name: Holly Wilson  DOB: March 02, 1996    Initial Comment Caller States she has been vomiting, she is [redacted] weeks pregnant    Nurse Assessment  Nurse: Wynetta Emery, RN, Baker Janus Date/Time (Eastern Time): 07/20/2016 8:38:51 AM  Confirm and document reason for call. If symptomatic, describe symptoms. ---[name] is is 13 weeks 4 days has been vomiting for 8 to 9 weeks but yesterday could not keep anything down threw up everything yesterday usually could keep something down. seen in ED last evening and given IV fluids and zofran.  Does the patient have any new or worsening symptoms? ---Yes  Will a triage be completed? ---Yes  Related visit to physician within the last 2 weeks? ---No  Does the PT have any chronic conditions? (i.e. diabetes, asthma, etc.) ---No  Is the patient pregnant or possibly pregnant? (Ask all females between the ages of 62-55) ---Yes  How many weeks gestation? ---13 weeks 4 weeks  What is the estimated delivery date? ---2017-01-21  Total number of pregnancies including current? ---2  Number of live births? ---1  Have you felt decreased fetal movement? ---Early Pregnancy - No Fetal Movement Felt Yet  Is this a behavioral health or substance abuse call? ---No     Guidelines    Guideline Title Affirmed Question Affirmed Notes  Pregnancy - Morning Sickness (Nausea and Vomiting of Pregnancy) Weight loss > 5 pounds (2.5 kg) in last 2 weeks    Final Disposition User   See Physician within 4 Hours (or PCP triage) Wynetta Emery, RN, Baker Janus    Comments  NOTE APPT GIVEN FOR 3/19-2018 815 am with Shary Key, NP for possible hyperemesis gravidarum   Referrals  GO TO FACILITY UNDECIDED   Disagree/Comply: Comply

## 2016-07-23 ENCOUNTER — Ambulatory Visit: Payer: Self-pay | Admitting: Family Medicine

## 2016-07-23 ENCOUNTER — Ambulatory Visit: Payer: Self-pay | Admitting: Nurse Practitioner

## 2016-07-23 ENCOUNTER — Ambulatory Visit (INDEPENDENT_AMBULATORY_CARE_PROVIDER_SITE_OTHER): Payer: Managed Care, Other (non HMO) | Admitting: Family Medicine

## 2016-07-23 ENCOUNTER — Encounter: Payer: Self-pay | Admitting: *Deleted

## 2016-07-23 VITALS — BP 88/59 | HR 82 | Wt 114.0 lb

## 2016-07-23 DIAGNOSIS — Z3480 Encounter for supervision of other normal pregnancy, unspecified trimester: Secondary | ICD-10-CM

## 2016-07-23 DIAGNOSIS — Z3482 Encounter for supervision of other normal pregnancy, second trimester: Secondary | ICD-10-CM

## 2016-07-23 NOTE — Addendum Note (Signed)
Addended by: Gretchen Short on: 07/23/2016 04:53 PM   Modules accepted: Orders

## 2016-07-23 NOTE — Telephone Encounter (Signed)
Pt had an appt with OB GYN today instead

## 2016-07-23 NOTE — Progress Notes (Signed)
   PRENATAL VISIT NOTE  Subjective:  Holly Wilson is a 21 y.o. G2P1001 at [redacted]w[redacted]d being seen today for ongoing prenatal care.  She is currently monitored for the following issues for this low-risk pregnancy and has Supervision of normal pregnancy, antepartum on her problem list.  Patient reports nausea and vomiting.  Contractions: Not present. Vag. Bleeding: None.  Movement: Absent. Denies leaking of fluid.   The following portions of the patient's history were reviewed and updated as appropriate: allergies, current medications, past family history, past medical history, past social history, past surgical history and problem list. Problem list updated.  Objective:   Vitals:   07/23/16 1621  BP: (!) 88/59  Pulse: 82  Weight: 114 lb (51.7 kg)    Fetal Status: Fetal Heart Rate (bpm): 163   Movement: Absent     General:  Alert, oriented and cooperative. Patient is in no acute distress.  Skin: Skin is warm and dry. No rash noted.   Cardiovascular: Normal heart rate noted  Respiratory: Normal respiratory effort, no problems with respiration noted  Abdomen: Soft, gravid, appropriate for gestational age. Pain/Pressure: Absent     Pelvic:  Cervical exam deferred        Extremities: Normal range of motion.  Edema: None  Mental Status: Normal mood and affect. Normal behavior. Normal judgment and thought content.   Assessment and Plan:  Pregnancy: G2P1001 at [redacted]w[redacted]d  1. Supervision of other normal pregnancy, antepartum Continue routine prenatal care.--Baby Scripts Patient First screen is normal Anatomy u/s ordered Complete New OB labs - [place] MFM OB COMP + 14 WK; Future - ABO/Rh; Future - Antibody screen  General obstetric precautions including but not limited to vaginal bleeding, contractions, leaking of fluid and fetal movement were reviewed in detail with the patient. Please refer to After Visit Summary for other counseling recommendations.  Return in 4 weeks (on 08/20/2016).   Donnamae Jude, MD

## 2016-07-24 LAB — ABO AND RH: Rh Factor: POSITIVE

## 2016-07-24 LAB — ANTIBODY SCREEN: Antibody Screen: NEGATIVE

## 2016-07-26 NOTE — Telephone Encounter (Signed)
error 

## 2016-07-27 ENCOUNTER — Encounter (HOSPITAL_COMMUNITY): Payer: Self-pay | Admitting: *Deleted

## 2016-07-27 ENCOUNTER — Inpatient Hospital Stay (HOSPITAL_COMMUNITY)
Admission: AD | Admit: 2016-07-27 | Discharge: 2016-07-27 | Disposition: A | Discharge status: Home / Self Care | Payer: Managed Care, Other (non HMO) | Source: Ambulatory Visit | Attending: Obstetrics & Gynecology | Admitting: Obstetrics & Gynecology

## 2016-07-27 DIAGNOSIS — O99612 Diseases of the digestive system complicating pregnancy, second trimester: Secondary | ICD-10-CM | POA: Insufficient documentation

## 2016-07-27 DIAGNOSIS — K529 Noninfective gastroenteritis and colitis, unspecified: Secondary | ICD-10-CM | POA: Diagnosis not present

## 2016-07-27 DIAGNOSIS — O210 Mild hyperemesis gravidarum: Secondary | ICD-10-CM | POA: Diagnosis present

## 2016-07-27 DIAGNOSIS — Z3A14 14 weeks gestation of pregnancy: Secondary | ICD-10-CM | POA: Insufficient documentation

## 2016-07-27 DIAGNOSIS — K5289 Other specified noninfective gastroenteritis and colitis: Secondary | ICD-10-CM

## 2016-07-27 DIAGNOSIS — Z880 Allergy status to penicillin: Secondary | ICD-10-CM | POA: Diagnosis not present

## 2016-07-27 LAB — URINALYSIS, ROUTINE W REFLEX MICROSCOPIC
BILIRUBIN URINE: NEGATIVE
Glucose, UA: NEGATIVE mg/dL
Hgb urine dipstick: NEGATIVE
Ketones, ur: 80 mg/dL — AB
LEUKOCYTES UA: NEGATIVE
Nitrite: NEGATIVE
PH: 6 (ref 5.0–8.0)
Protein, ur: 30 mg/dL — AB
SPECIFIC GRAVITY, URINE: 1.029 (ref 1.005–1.030)

## 2016-07-27 MED ORDER — ONDANSETRON 4 MG PO TBDP
4.0000 mg | ORAL_TABLET | Freq: Once | ORAL | Status: AC
  Administered 2016-07-27: 4 mg via ORAL
  Filled 2016-07-27: qty 1

## 2016-07-27 MED ORDER — ONDANSETRON 4 MG PO TBDP: 4.0000 mg | ORAL_TABLET | Freq: Three times a day (TID) | ORAL | 0 refills | Status: DC | PRN

## 2016-07-27 MED ORDER — LACTATED RINGERS IV BOLUS (SEPSIS)
1000.0000 mL | Freq: Once | INTRAVENOUS | Status: AC
  Administered 2016-07-27: 1000 mL via INTRAVENOUS

## 2016-07-27 NOTE — MAU Note (Signed)
Pt presents c/o "I have the stomach bug."  Has been around friends who have it.  Vomiting and diarrhea since 11pm.  No fever. Ran out of zofran from pregnancy and has not taken anything.  Unable to keep fluids down.

## 2016-07-27 NOTE — MAU Provider Note (Signed)
Chief Complaint: Nausea; Emesis; and Diarrhea   SUBJECTIVE HPI: Holly Wilson is a 21 y.o. G2P1001 at [redacted]w[redacted]d who presents to Maternity Admissions reporting intractable nausea/vomiting/diarrhea.   Has had intractable N/V already for 11 weeks, has diarrhea on/off. This time she feels like it is actually a "stomach bug", she thinks from her brother and other family members and friends (all have it). Started at 11pm last night. She feels like large amounts of water per rectum when she goes. Vomited since last night about 10 times, diarrhea with every emesis. Used to take Zofran, helped but ran out. No blood in vomit or stool. +dizziness, weak. Patient has lost about 8 lbs since 3/15 (8 days ago) when she was last in MAU. Denies F/C, CP/SOB.     Past Medical History:  Diagnosis Date  . No pertinent past medical history    OB History  Gravida Para Term Preterm AB Living  2 1 1  0 0 1  SAB TAB Ectopic Multiple Live Births  0 0 0 0 1    # Outcome Date GA Lbr Len/2nd Weight Sex Delivery Anes PTL Lv  2 Current           1 Term 06/25/13 [redacted]w[redacted]d   F Vag-Spont EPI  LIV    Obstetric Comments  Fetal and maternal tachycardia during labor   Past Surgical History:  Procedure Laterality Date  . NO PAST SURGERIES     Social History   Social History  . Marital status: Single    Spouse name: N/A  . Number of children: 1  . Years of education: N/A   Occupational History  . aesthetician    Social History Main Topics  . Smoking status: Never Smoker  . Smokeless tobacco: Never Used  . Alcohol use No  . Drug use: No  . Sexual activity: Yes    Partners: Male    Birth control/ protection: None   Other Topics Concern  . Not on file   Social History Narrative   Sexually active   Mom- Holly Wilson to play volleyball, softball and go to [employer] actively involved      13 year old child born in 2015, daughter, Holly Wilson   Together with father   Working as an Engineering geologist   No  current facility-administered medications on file prior to encounter.    Current Outpatient Prescriptions on File Prior to Encounter  Medication Sig Dispense Refill  . ondansetron (ZOFRAN ODT) 4 MG disintegrating tablet Take 1 tablet (4 mg total) by mouth every 8 (eight) hours as needed for nausea or vomiting. 20 tablet 0  . Prenatal Vit-Fe Fumarate-FA (PRENATAL MULTIVITAMIN) TABS tablet Take 1 tablet by mouth daily at 12 noon.    . Doxylamine-Pyridoxine (DICLEGIS) 10-10 MG TBEC Take 2 tab qhs, add 2 midday if Sx persist (Patient not taking: Reported on 07/23/2016) 60 tablet 2   Allergies  Allergen Reactions  . Amoxicillin     REACTION: rash  . Penicillins Rash    Has patient had a PCN reaction causing immediate rash, facial/tongue/throat swelling, SOB or lightheadedness with hypotension: no Has patient had a PCN reaction causing severe rash involving mucus membranes or skin necrosis: no Has patient had a PCN reaction that required hospitalization no Has patient had a PCN reaction occurring within the last 10 years: n0 If all of the above answers are "NO", then may proceed with Cephalosporin use.     I have reviewed the past Medical  Hx, Surgical Hx, Social Hx, Allergies and Medications.   REVIEW OF SYSTEMS  A comprehensive ROS was negative except per HPI.   OBJECTIVE Patient Vitals for the past 24 hrs:  BP Temp Temp src Pulse Resp SpO2  07/27/16 0837 116/62 97.5 F (36.4 C) Oral (!) 107 18 100 %    PHYSICAL EXAM Constitutional: Well-developed, thin female in no acute distress.  HEENT: Atraumatic, normocephalic; EOMI, tachy mucous membranes, no tonsillar exudates, no posterior pharynx erythema Cardiovascular: mildly tachycardic, normal rhythm, no murmurs; good capillary refills Respiratory: normal rate and effort.  CTAB GI: Abd soft, non-tender, non-distended. Pos BS x 4 MS: Extremities nontender, no edema, normal ROM Neurologic: Alert and oriented x 4.  GU: Neg CVAT.   LAB  RESULTS Results for orders placed or performed during the hospital encounter of 07/27/16 (from the past 24 hour(s))  Urinalysis, Routine w reflex microscopic     Status: Abnormal   Collection Time: 07/27/16  8:23 AM  Result Value Ref Range   Color, Urine YELLOW YELLOW   APPearance HAZY (A) CLEAR   Specific Gravity, Urine 1.029 1.005 - 1.030   pH 6.0 5.0 - 8.0   Glucose, UA NEGATIVE NEGATIVE mg/dL   Hgb urine dipstick NEGATIVE NEGATIVE   Bilirubin Urine NEGATIVE NEGATIVE   Ketones, ur 80 (A) NEGATIVE mg/dL   Protein, ur 30 (A) NEGATIVE mg/dL   Nitrite NEGATIVE NEGATIVE   Leukocytes, UA NEGATIVE NEGATIVE   RBC / HPF 0-5 0 - 5 RBC/hpf   WBC, UA 0-5 0 - 5 WBC/hpf   Bacteria, UA RARE (A) NONE SEEN   Squamous Epithelial / LPF 6-30 (A) NONE SEEN   Mucous PRESENT     IMAGING [place] Mfm Fetal Nuchal Translucency  Result Date: 07/16/2016 ----------------------------------------------------------------------  OBSTETRICS REPORT                      (Signed Final 07/16/2016 11:35 am) ---------------------------------------------------------------------- Patient Info  ID #:       [id number]                         D.O.B.:   [birth year] (20 yrs)  Name:       Holly Wilson                  Visit Date:  07/16/2016 11:17 am ---------------------------------------------------------------------- Performed By  Performed By:     Jeanene Erb BS,      Ref. Address:     [place]  Attending:        Silvestre Moment [place]        Location:         [facility]                    MD  Referred By:      [facility] ---------------------------------------------------------------------- Orders   #  Description                                 Code   1  [place] MFM FETAL NUCHAL                         [phone]      TRANSLUCENCY   ----------------------------------------------------------------------   #  Ordered By               Order #        Accession #    Episode #   1  Verita Schneiders           [id number]      [id number]     [id number]  ---------------------------------------------------------------------- Indications   [redacted] weeks gestation of pregnancy                Z3A.13   Encounter for nuchal translucency              Z36.82  ---------------------------------------------------------------------- OB History  Blood Type:            Height:  5'3"   Weight (lb):  114      BMI:   20.19  Gravidity:    2         Term:   1  Living:       1 ---------------------------------------------------------------------- Fetal Evaluation  Num Of Fetuses:     1  Fetal Heart         148  Rate(bpm):  Cardiac Activity:   Observed  Presentation:       Variable ---------------------------------------------------------------------- Biometry  CRL:        74  mm     G. Age:  13w 2d                  EDD:   01/19/17 ---------------------------------------------------------------------- Gestational Age  LMP:           15w 2d       Date:   03/31/16                 EDD:   01/05/17  Best:          [place] 0d    Det. By:   Loman Chroman         EDD:   01/21/17                                      (06/18/16) ---------------------------------------------------------------------- 1st Trimester Genetic Sonogram Screening  CRL:              74  mm    G. Age:   13w 2d                 EDD:   01/19/17  Nuc Trans:         2  mm ---------------------------------------------------------------------- Impression  Single IUP at 13w 0d  Normal NT (2.0 mm).  Nasal bone visualized.  First trimester aneuploidy screen performed as noted above.  Please do not draw triple/quad screen, though patient should  be offered MSAFP for neural tube defect screening. ---------------------------------------------------------------------- Recommendations  Recommend ultrasound for fetal anatomy  at 18-[redacted]  weeks  gestation ----------------------------------------------------------------------                Kerry Kass, MD Electronically Signed Final Report   07/16/2016 11:35 am ----------------------------------------------------------------------   MAU COURSE IVF bolus Zofran ODT UA- +Ketones, +protein, clean PO challenge, able to tolerate crackers and water, feels better  MDM Plan of care reviewed with patient, including labs and tests ordered and medical treatment. After IV fluids and Zofran ODT, patient feels much better and would like to go home. We'll give patient's prescription for Zofran, recommended to take around-the-clock until she is starting to feel better. Encouraged oral intake to rehydrate and to stop any further weight loss.   ASSESSMENT 1. Other noninfectious gastroenteritis   2. Hyperemesis gravidarum     PLAN Discharge home in stable condition. Zofran ODT refilled Encourage PO intake, follow up with OB/GYN or return if continues to lose weight and not tolerate  intake   Allergies as of 07/27/2016      Reactions   Amoxicillin    REACTION: rash   Penicillins Rash   Has patient had a PCN reaction causing immediate rash, facial/tongue/throat swelling, SOB or lightheadedness with hypotension: no Has patient had a PCN reaction causing severe rash involving mucus membranes or skin necrosis: no Has patient had a PCN reaction that required hospitalization no Has patient had a PCN reaction occurring within the last 10 years: n0 If all of the above answers are "NO", then may proceed with Cephalosporin use.      Medication List    TAKE these medications   Doxylamine-Pyridoxine 10-10 MG Tbec Commonly known as:  DICLEGIS Take 2 tab qhs, add 2 midday if Sx persist   ondansetron 4 MG disintegrating tablet Commonly known as:  ZOFRAN ODT Take 1 tablet (4 mg total) by mouth every 8 (eight) hours as needed for nausea or vomiting. What changed:  Another medication with  the same name was added. Make sure you understand how and when to take each.   ondansetron 4 MG disintegrating tablet Commonly known as:  ZOFRAN ODT Take 1 tablet (4 mg total) by mouth every 8 (eight) hours as needed for nausea or vomiting. What changed:  You were already taking a medication with the same name, and this prescription was added. Make sure you understand how and when to take each.   prenatal multivitamin Tabs tablet Take 1 tablet by mouth daily at 12 noon.        Katherine Basset, DO OB Fellow 07/27/2016 11:37 AM

## 2016-07-27 NOTE — Discharge Instructions (Signed)
Diarrhea, Adult Diarrhea is when you have loose and water poop (stool) often. Diarrhea can make you feel weak and cause you to get dehydrated. Dehydration can make you tired and thirsty, make you have a dry mouth, and make it so you pee (urinate) less often. Diarrhea often lasts 2-3 days. However, it can last longer if it is a sign of something more serious. It is important to treat your diarrhea as told by your doctor. Follow these instructions at home: Eating and drinking   Follow these recommendations as told by your doctor:  Take an oral rehydration solution (ORS). This is a drink that is sold at pharmacies and stores.  Drink clear fluids, such as:  Water.  Ice chips.  Diluted fruit juice.  Low-calorie sports drinks.  Eat bland, easy-to-digest foods in small amounts as you are able. These foods include:  Bananas.  Applesauce.  Rice.  Low-fat (lean) meats.  Toast.  Crackers.  Avoid drinking fluids that have a lot of sugar or caffeine in them.  Avoid alcohol.  Avoid spicy or fatty foods. General instructions    Drink enough fluid to keep your pee (urine) clear or pale yellow.  Wash your hands often. If you cannot use soap and water, use hand sanitizer.  Make sure that all people in your home wash their hands well and often.  Take over-the-counter and prescription medicines only as told by your doctor.  Rest at home while you get better.  Watch your condition for any changes.  Take a warm bath to help with any burning or pain from having diarrhea.  Keep all follow-up visits as told by your doctor. This is important. Contact a doctor if:  You have a fever.  Your diarrhea gets worse.  You have new symptoms.  You cannot keep fluids down.  You feel light-headed or dizzy.  You have a headache.  You have muscle cramps. Get help right away if:  You have chest pain.  You feel very weak or you pass out (faint).  You have bloody or black poop or  poop that look like tar.  You have very bad pain, cramping, or bloating in your belly (abdomen).  You have trouble breathing or you are breathing very quickly.  Your heart is beating very quickly.  Your skin feels cold and clammy.  You feel confused.  You have signs of dehydration, such as:  Dark pee, hardly any pee, or no pee.  Cracked lips.  Dry mouth.  Sunken eyes.  Sleepiness.  Weakness. This information is not intended to replace advice given to you by your health care provider. Make sure you discuss any questions you have with your health care provider. Document Released: 10/10/2007 Document Revised: 11/11/2015 Document Reviewed: 12/28/2014 Elsevier Interactive Patient Education  2017 [employer] Norovirus Infection A norovirus infection is caused by exposure to a virus in a group of similar viruses (noroviruses). This type of infection causes inflammation in your stomach and intestines (gastroenteritis). Norovirus is the most common cause of gastroenteritis. It also causes food poisoning. Anyone can get a norovirus infection. It spreads very easily (contagious). You can get it from contaminated food, water, surfaces, or other people. Norovirus is found in the stool or vomit of infected people. You can spread the infection as soon as you feel sick until 2 weeks after you recover. Symptoms usually begin within 2 days after you become infected. Most norovirus symptoms affect the digestive system. What are the causes? Norovirus infection is  caused by contact with norovirus. You can catch norovirus if you:  Eat or drink something contaminated with norovirus.  Touch surfaces or objects contaminated with norovirus and then put your hand in your mouth.  Have direct contact with an infected person who has symptoms.  Share food, drink, or utensils with someone with who is sick with norovirus. What are the signs or symptoms? Symptoms of norovirus may  include:  Nausea.  Vomiting.  Diarrhea.  Stomach cramps.  Fever.  Chills.  Headache.  Muscle aches.  Tiredness. How is this diagnosed? Your health care provider may suspect norovirus based on your symptoms and physical exam. Your health care provider may also test a sample of your stool or vomit for the virus. How is this treated? There is no specific treatment for norovirus. Most people get better without treatment in about 2 days. Follow these instructions at home:  Replace lost fluids by drinking plenty of water or rehydration fluids containing important minerals called electrolytes. This prevents dehydration. Drink enough fluid to keep your urine clear or pale yellow.  Do not prepare food for others while you are infected. Wait at least 3 days after recovering from the illness to do that. How is this prevented?  Wash your hands often, especially after using the toilet or changing a diaper.  Wash fruits and vegetables thoroughly before preparing or serving them.  Throw out any food that a sick person may have touched.  Disinfect contaminated surfaces immediately after someone in the household has been sick. Use a bleach-based household cleaner.  Immediately remove and wash soiled clothes or sheets. Contact a health care provider if:  Your vomiting, diarrhea, and stomach pain is getting worse.  Your symptoms of norovirus do not go away after 2-3 days. Get help right away if: You develop symptoms of dehydration that do not improve with fluid replacement. This may include:  Excessive sleepiness.  Lack of tears.  Dry mouth.  Dizziness when standing.  Weak pulse. This information is not intended to replace advice given to you by your health care provider. Make sure you discuss any questions you have with your health care provider. Document Released: 07/14/2002 Document Revised: 09/29/2015 Document Reviewed: 10/01/2013 Elsevier Interactive Patient Education   2017 [employer].

## 2016-08-20 ENCOUNTER — Encounter: Payer: Managed Care, Other (non HMO) | Admitting: Obstetrics & Gynecology

## 2016-08-29 ENCOUNTER — Other Ambulatory Visit: Payer: Self-pay | Admitting: Family Medicine

## 2016-08-29 ENCOUNTER — Ambulatory Visit (HOSPITAL_COMMUNITY)
Admission: RE | Admit: 2016-08-29 | Discharge: 2016-08-29 | Disposition: A | Discharge status: Home / Self Care | Payer: Managed Care, Other (non HMO) | Source: Ambulatory Visit | Attending: Family Medicine | Admitting: Family Medicine

## 2016-08-29 DIAGNOSIS — Z369 Encounter for antenatal screening, unspecified: Secondary | ICD-10-CM

## 2016-08-29 DIAGNOSIS — Z3480 Encounter for supervision of other normal pregnancy, unspecified trimester: Secondary | ICD-10-CM

## 2016-08-29 DIAGNOSIS — Z3689 Encounter for other specified antenatal screening: Secondary | ICD-10-CM | POA: Insufficient documentation

## 2016-08-29 DIAGNOSIS — Z3A19 19 weeks gestation of pregnancy: Secondary | ICD-10-CM | POA: Diagnosis not present

## 2016-09-04 ENCOUNTER — Ambulatory Visit (INDEPENDENT_AMBULATORY_CARE_PROVIDER_SITE_OTHER): Payer: Managed Care, Other (non HMO) | Admitting: Obstetrics & Gynecology

## 2016-09-04 VITALS — BP 100/62 | HR 97 | Wt 117.0 lb

## 2016-09-04 DIAGNOSIS — Z3480 Encounter for supervision of other normal pregnancy, unspecified trimester: Secondary | ICD-10-CM

## 2016-09-04 DIAGNOSIS — Z3482 Encounter for supervision of other normal pregnancy, second trimester: Secondary | ICD-10-CM

## 2016-09-04 NOTE — Patient Instructions (Signed)
Third Trimester of Pregnancy The third trimester is from week 28 through week 40 (months 7 through 9). The third trimester is a time when the unborn baby (fetus) is growing rapidly. At the end of the ninth month, the fetus is about 20 inches in length and weighs 6-10 pounds. Body changes during your third trimester Your body will continue to go through many changes during pregnancy. The changes vary from woman to woman. During the third trimester:  Your weight will continue to increase. You can expect to gain 25-35 pounds (11-16 kg) by the end of the pregnancy.  You may begin to get stretch marks on your hips, abdomen, and breasts.  You may urinate more often because the fetus is moving lower into your pelvis and pressing on your bladder.  You may develop or continue to have heartburn. This is caused by increased hormones that slow down muscles in the digestive tract.  You may develop or continue to have constipation because increased hormones slow digestion and cause the muscles that push waste through your intestines to relax.  You may develop hemorrhoids. These are swollen veins (varicose veins) in the rectum that can itch or be painful.  You may develop swollen, bulging veins (varicose veins) in your legs.  You may have increased body aches in the pelvis, back, or thighs. This is due to weight gain and increased hormones that are relaxing your joints.  You may have changes in your hair. These can include thickening of your hair, rapid growth, and changes in texture. Some women also have hair loss during or after pregnancy, or hair that feels dry or thin. Your hair will most likely return to normal after your baby is born.  Your breasts will continue to grow and they will continue to become tender. A yellow fluid (colostrum) may leak from your breasts. This is the first milk you are producing for your baby.  Your belly button may stick out.  You may notice more swelling in your hands,  face, or ankles.  You may have increased tingling or numbness in your hands, arms, and legs. The skin on your belly may also feel numb.  You may feel short of breath because of your expanding uterus.  You may have more problems sleeping. This can be caused by the size of your belly, increased need to urinate, and an increase in your body's metabolism.  You may notice the fetus "dropping," or moving lower in your abdomen (lightening).  You may have increased vaginal discharge.  You may notice your joints feel loose and you may have pain around your pelvic bone.  What to expect at prenatal visits You will have prenatal exams every 2 weeks until week 36. Then you will have weekly prenatal exams. During a routine prenatal visit:  You will be weighed to make sure you and the baby are growing normally.  Your blood pressure will be taken.  Your abdomen will be measured to track your baby's growth.  The fetal heartbeat will be listened to.  Any test results from the previous visit will be discussed.  You may have a cervical check near your due date to see if your cervix has softened or thinned (effaced).  You will be tested for Group B streptococcus. This happens between 35 and 37 weeks.  Your health care provider may ask you:  What your birth plan is.  How you are feeling.  If you are feeling the baby move.  If you have had   any abnormal symptoms, such as leaking fluid, bleeding, severe headaches, or abdominal cramping.  If you are using any tobacco products, including cigarettes, chewing tobacco, and electronic cigarettes.  If you have any questions.  Other tests or screenings that may be performed during your third trimester include:  Blood tests that check for low iron levels (anemia).  Fetal testing to check the health, activity level, and growth of the fetus. Testing is done if you have certain medical conditions or if there are problems during the  pregnancy.  Nonstress test (NST). This test checks the health of your baby to make sure there are no signs of problems, such as the baby not getting enough oxygen. During this test, a belt is placed around your belly. The baby is made to move, and its heart rate is monitored during movement.  What is false labor? False labor is a condition in which you feel small, irregular tightenings of the muscles in the womb (contractions) that usually go away with rest, changing position, or drinking water. These are called Braxton Hicks contractions. Contractions may last for hours, days, or even weeks before true labor sets in. If contractions come at regular intervals, become more frequent, increase in intensity, or become painful, you should see your health care provider. What are the signs of labor?  Abdominal cramps.  Regular contractions that start at 10 minutes apart and become stronger and more frequent with time.  Contractions that start on the top of the uterus and spread down to the lower abdomen and back.  Increased pelvic pressure and dull back pain.  A watery or bloody mucus discharge that comes from the vagina.  Leaking of amniotic fluid. This is also known as your "water breaking." It could be a slow trickle or a gush. Let your health care provider know if it has a color or strange odor. If you have any of these signs, call your health care provider right away, even if it is before your due date. Follow these instructions at home: Medicines  Follow your health care provider's instructions regarding medicine use. Specific medicines may be either safe or unsafe to take during pregnancy.  Take a prenatal vitamin that contains at least 600 micrograms (mcg) of folic acid.  If you develop constipation, try taking a stool softener if your health care provider approves. Eating and drinking  Eat a balanced diet that includes fresh fruits and vegetables, whole grains, good sources of protein  such as meat, eggs, or tofu, and low-fat dairy. Your health care provider will help you determine the amount of weight gain that is right for you.  Avoid raw meat and uncooked cheese. These carry germs that can cause birth defects in the baby.  If you have low calcium intake from food, talk to your health care provider about whether you should take a daily calcium supplement.  Eat four or five small meals rather than three large meals a day.  Limit foods that are high in fat and processed sugars, such as fried and sweet foods.  To prevent constipation: ? Drink enough fluid to keep your urine clear or pale yellow. ? Eat foods that are high in fiber, such as fresh fruits and vegetables, whole grains, and beans. Activity  Exercise only as directed by your health care provider. Most women can continue their usual exercise routine during pregnancy. Try to exercise for 30 minutes at least 5 days a week. Stop exercising if you experience uterine contractions.  Avoid heavy   lifting.  Do not exercise in extreme heat or humidity, or at high altitudes.  Wear low-heel, comfortable shoes.  Practice good posture.  You may continue to have sex unless your health care provider tells you otherwise. Relieving pain and discomfort  Take frequent breaks and rest with your legs elevated if you have leg cramps or low back pain.  Take warm sitz baths to soothe any pain or discomfort caused by hemorrhoids. Use hemorrhoid cream if your health care provider approves.  Wear a good support bra to prevent discomfort from breast tenderness.  If you develop varicose veins: ? Wear support pantyhose or compression stockings as told by your healthcare provider. ? Elevate your feet for 15 minutes, 3-4 times a day. Prenatal care  Write down your questions. Take them to your prenatal visits.  Keep all your prenatal visits as told by your health care provider. This is important. Safety  Wear your seat belt at  all times when driving.  Make a list of emergency phone numbers, including numbers for family, friends, the hospital, and police and fire departments. General instructions  Avoid cat litter boxes and soil used by cats. These carry germs that can cause birth defects in the baby. If you have a cat, ask someone to clean the litter box for you.  Do not travel far distances unless it is absolutely necessary and only with the approval of your health care provider.  Do not use hot tubs, steam rooms, or saunas.  Do not drink alcohol.  Do not use any products that contain nicotine or tobacco, such as cigarettes and e-cigarettes. If you need help quitting, ask your health care provider.  Do not use any medicinal herbs or unprescribed drugs. These chemicals affect the formation and growth of the baby.  Do not douche or use tampons or scented sanitary pads.  Do not cross your legs for long periods of time.  To prepare for the arrival of your baby: ? Take prenatal classes to understand, practice, and ask questions about labor and delivery. ? Make a trial run to the hospital. ? Visit the hospital and tour the maternity area. ? Arrange for maternity or paternity leave through employers. ? Arrange for family and friends to take care of pets while you are in the hospital. ? Purchase a rear-facing car seat and make sure you know how to install it in your car. ? Pack your hospital bag. ? Prepare the baby's nursery. Make sure to remove all pillows and stuffed animals from the baby's crib to prevent suffocation.  Visit your dentist if you have not gone during your pregnancy. Use a soft toothbrush to brush your teeth and be gentle when you floss. Contact a health care provider if:  You are unsure if you are in labor or if your water has broken.  You become dizzy.  You have mild pelvic cramps, pelvic pressure, or nagging pain in your abdominal area.  You have lower back pain.  You have persistent  nausea, vomiting, or diarrhea.  You have an unusual or bad smelling vaginal discharge.  You have pain when you urinate. Get help right away if:  Your water breaks before 37 weeks.  You have regular contractions less than 5 minutes apart before 37 weeks.  You have a fever.  You are leaking fluid from your vagina.  You have spotting or bleeding from your vagina.  You have severe abdominal pain or cramping.  You have rapid weight loss or weight gain.    You have shortness of breath with chest pain.  You notice sudden or extreme swelling of your face, hands, ankles, feet, or legs.  Your baby makes fewer than 10 movements in 2 hours.  You have severe headaches that do not go away when you take medicine.  You have vision changes. Summary  The third trimester is from week 28 through week 40, months 7 through 9. The third trimester is a time when the unborn baby (fetus) is growing rapidly.  During the third trimester, your discomfort may increase as you and your baby continue to gain weight. You may have abdominal, leg, and back pain, sleeping problems, and an increased need to urinate.  During the third trimester your breasts will keep growing and they will continue to become tender. A yellow fluid (colostrum) may leak from your breasts. This is the first milk you are producing for your baby.  False labor is a condition in which you feel small, irregular tightenings of the muscles in the womb (contractions) that eventually go away. These are called Braxton Hicks contractions. Contractions may last for hours, days, or even weeks before true labor sets in.  Signs of labor can include: abdominal cramps; regular contractions that start at 10 minutes apart and become stronger and more frequent with time; watery or bloody mucus discharge that comes from the vagina; increased pelvic pressure and dull back pain; and leaking of amniotic fluid. This information is not intended to replace advice  given to you by your health care provider. Make sure you discuss any questions you have with your health care provider. Document Released: 04/17/2001 Document Revised: 09/29/2015 Document Reviewed: 06/24/2012 Elsevier Interactive Patient Education  2017 [employer]  

## 2016-09-04 NOTE — Progress Notes (Signed)
PRENATAL VISIT NOTE  Subjective:  Holly Wilson is a 21 y.o. G2P1001 at [redacted]w[redacted]d being seen today for ongoing prenatal care.  She is currently monitored for the following issues for this low-risk pregnancy and has Supervision of normal pregnancy, antepartum on her problem list.  Patient reports no complaints.  Contractions: Not present. Vag. Bleeding: None.  Movement: Present. Denies leaking of fluid.   The following portions of the patient's history were reviewed and updated as appropriate: allergies, current medications, past family history, past medical history, past social history, past surgical history and problem list. Problem list updated.  Objective:   Vitals:   09/04/16 1455  BP: 100/62  Pulse: 97  Weight: 117 lb (53.1 kg)    Fetal Status: Fetal Heart Rate (bpm): 160   Movement: Present     General:  Alert, oriented and cooperative. Patient is in no acute distress.  Skin: Skin is warm and dry. No rash noted.   Cardiovascular: Normal heart rate noted  Respiratory: Normal respiratory effort, no problems with respiration noted  Abdomen: Soft, gravid, appropriate for gestational age. Pain/Pressure: Absent     Pelvic:  Cervical exam deferred        Extremities: Normal range of motion.  Edema: None  Mental Status: Normal mood and affect. Normal behavior. Normal judgment and thought content.   [place] Mfm Ob Comp + 14 Wk  Result Date: 08/29/2016 ----------------------------------------------------------------------  OBSTETRICS REPORT                      (Signed Final 08/29/2016 09:42 am) ---------------------------------------------------------------------- Patient Info  ID #:       [id number]                          D.O.B.:  [birth year] (21 yrs)  Name:       Holly Wilson                   Visit Date: 08/29/2016 08:16 am ---------------------------------------------------------------------- Performed By  Performed By:     Tamela Gammon RDMS      Ref. Address:     [place]  Attending:        Oralia Rud       Location:         [facility]                    MD  Referred By:      [facility] for                    [facility]                    Healthcare ---------------------------------------------------------------------- Orders   #  Description                                 Code   1  [place] MFM OB COMP + 62 WK                      [id number]  ----------------------------------------------------------------------   #  Ordered By               Order #        Accession #    Episode #   1  Darron Doom              [id number]      [id number]     [id number]  ---------------------------------------------------------------------- Indications   [redacted] weeks gestation of pregnancy                Z3A.19   Encounter for antenatal screening,             Z36.9   unspecified  ---------------------------------------------------------------------- OB History  Blood Type:            Height:  5'3"   Weight (lb):  114       BMI:  20.19  Gravidity:    2         Term:   1  Living:       1 ---------------------------------------------------------------------- Fetal Evaluation  Num Of Fetuses:     1  Fetal Heart         149  Rate(bpm):  Cardiac Activity:   Observed  Presentation:       Variable  Placenta:           Anterior, above cervical os  P. Cord Insertion:  Visualized, central  Amniotic Fluid  AFI FV:      Subjectively within normal limits                              Largest Pocket(cm)                              4.2 ---------------------------------------------------------------------- Biometry  BPD:        42  mm     G. Age:  18w 5d         26  %    CI:        69.44   %    70 - 86                                                          FL/HC:      17.7   %    16.1 - 18.3  HC:      160.9  mm     G. Age:  18w 6d         25  %    HC/AC:      1.17        1.09 - 1.39  AC:      137.5  mm     G. Age:  19w 1d         42  %     FL/BPD:     67.9   %  FL:       28.5  mm     G. Age:  18w 5d         25  %  FL/AC:      20.7   %    20 - 24  HUM:      27.1  mm     G. Age:  18w 4d         33  %  CER:        19  mm     G. Age:  18w 4d         29  %  NFT:       3.8  mm  CM:        4.5  mm  Est. FW:     266  gm      0 lb 9 oz     39  % ---------------------------------------------------------------------- Gestational Age  LMP:           21w 4d        Date:  03/31/16                 EDD:   01/05/17  U/S Today:     18w 6d                                        EDD:   01/24/17  Best:          19w 2d     Det. By:  Loman Chroman         EDD:   01/21/17                                      (06/18/16) ---------------------------------------------------------------------- Anatomy  Cranium:               Appears normal         Aortic Arch:            Appears normal  Cavum:                 Appears normal         Ductal Arch:            Appears normal  Ventricles:            Appears normal         Diaphragm:              Appears normal  Choroid Plexus:        Appears normal         Stomach:                Appears normal, left                                                                        sided  Cerebellum:            Appears normal         Abdomen:                Appears normal  Posterior Fossa:       Appears normal         Abdominal Wall:  Appears nml (cord                                                                        insert, abd wall)  Nuchal Fold:           Appears normal         Cord Vessels:           Appears normal (3                                                                        vessel cord)  Face:                  Appears normal         Kidneys:                Appear normal                         (orbits and profile)  Lips:                  Appears normal         Bladder:                Appears normal  Thoracic:              Appears normal         Spine:                  Appears normal  Heart:                  Appears normal         Upper Extremities:      Appears normal                         (4CH, axis, and situs  RVOT:                  Appears normal         Lower Extremities:      Appears normal  LVOT:                  Appears normal  Other:  Fetus appears to be a female. Both heels and 5th digits noted ---------------------------------------------------------------------- Cervix Uterus Adnexa  Cervix  Length:           3.65  cm.  Normal appearance by transabdominal scan.  Uterus  No abnormality visualized.  Left Ovary  Within normal limits.  Right Ovary  Within normal limits.  Adnexa:       No abnormality visualized. ---------------------------------------------------------------------- Impression  SIUP at [redacted]w[redacted]d on attempted comprehensive fetal survey  active singleton fetus in variable presentation  no dysmorphic features demonstrated (survey completed)  today's biometry is consistent with appropriate fetal growth  with EFW at the 39th%'le  no previa  cervix is long and  closed ---------------------------------------------------------------------- Recommendations  Follow up ultrasounds as clinically indicated. ----------------------------------------------------------------------               Oralia Rud, MD Electronically Signed Final Report   08/29/2016 09:42 am ----------------------------------------------------------------------   Assessment and Plan:  Pregnancy: G2P1001 at [redacted]w[redacted]d  1. Supervision of other normal pregnancy, antepartum Normal first trimester screen and anatomy scan. AFP only today. - AFP, Serum, Open Spina Bifida Preterm labor symptoms and general obstetric precautions including but not limited to vaginal bleeding, contractions, leaking of fluid and fetal movement were reviewed in detail with the patient. Please refer to After Visit Summary for other counseling recommendations.  Return in about 8 weeks (around 10/30/2016) for 2 hr GTT, 3rd trimester labs, TDap, OB 28 week visit  (Babyscripts).   Osborne Oman, MD

## 2016-09-08 LAB — AFP, SERUM, OPEN SPINA BIFIDA
AFP MOM: 1.25
AFP VALUE AFPOSL: 84.3 ng/mL
Gest. Age on Collection Date: 20.1 weeks
MATERNAL AGE AT EDD: 22.4 a
OSBR Risk 1 IN: 5576
Test Results:: NEGATIVE
Weight: 117 [lb_av]

## 2016-11-01 ENCOUNTER — Encounter: Payer: Managed Care, Other (non HMO) | Admitting: Obstetrics & Gynecology

## 2016-11-21 ENCOUNTER — Ambulatory Visit (INDEPENDENT_AMBULATORY_CARE_PROVIDER_SITE_OTHER): Payer: Managed Care, Other (non HMO) | Admitting: Obstetrics & Gynecology

## 2016-11-21 ENCOUNTER — Other Ambulatory Visit: Payer: Managed Care, Other (non HMO) | Admitting: Obstetrics & Gynecology

## 2016-11-21 VITALS — BP 97/62 | HR 109 | Wt 128.0 lb

## 2016-11-21 DIAGNOSIS — Z3483 Encounter for supervision of other normal pregnancy, third trimester: Secondary | ICD-10-CM | POA: Diagnosis not present

## 2016-11-21 DIAGNOSIS — Z23 Encounter for immunization: Secondary | ICD-10-CM

## 2016-11-21 NOTE — Progress Notes (Signed)
   PRENATAL VISIT NOTE  Subjective:  Holly Wilson is a 21 y.o. G2P1001 at [redacted]w[redacted]d being seen today for ongoing prenatal care.  She is currently monitored for the following issues for this low-risk pregnancy and has Supervision of normal pregnancy, antepartum on her problem list.  Patient reports occasional contractions.  Concerned about preterm labor, desires cervical check.  Contractions: Irritability. Vag. Bleeding: None.  Movement: Present. Denies leaking of fluid.   The following portions of the patient's history were reviewed and updated as appropriate: allergies, current medications, past family history, past medical history, past social history, past surgical history and problem list. Problem list updated.  Objective:   Vitals:   11/21/16 0835  BP: 97/62  Pulse: (!) 109  Weight: 128 lb (58.1 kg)    Fetal Status: Fetal Heart Rate (bpm): 154 Fundal Height: 31 cm Movement: Present  Presentation: Vertex  General:  Alert, oriented and cooperative. Patient is in no acute distress.  Skin: Skin is warm and dry. No rash noted.   Cardiovascular: Normal heart rate noted  Respiratory: Normal respiratory effort, no problems with respiration noted  Abdomen: Soft, gravid, appropriate for gestational age.  Pain/Pressure: Present     Pelvic: Cervical exam performed Dilation: Fingertip Effacement (%): Thick Station: Ballotable  Extremities: Normal range of motion.  Edema: None  Mental Status:  Normal mood and affect. Normal behavior. Normal judgment and thought content.   Assessment and Plan:  Pregnancy: G2P1001 at [redacted]w[redacted]d  1. Encounter for supervision of other normal pregnancy in third trimester Third trimester labs and Tdap today. - CBC - RPR - HIV antibody - Glucose Tolerance, 2 Hours w/1 Hour - Tdap vaccine greater than or equal to 7yo IM Preterm labor symptoms and general obstetric precautions including but not limited to vaginal bleeding, contractions, leaking of fluid and fetal  movement were reviewed in detail with the patient. Please refer to After Visit Summary for other counseling recommendations.  Return in about 5 weeks (around 12/26/2016) for OB 36 week visit (Babyscripts), Pelvic cultures.   Verita Schneiders, MD

## 2016-11-21 NOTE — Patient Instructions (Signed)
Return to clinic for any scheduled appointments or obstetric concerns, or go to [facility] for evaluation  

## 2016-11-22 ENCOUNTER — Encounter: Payer: Self-pay | Admitting: *Deleted

## 2016-11-22 LAB — CBC
Hematocrit: 28.6 % — ABNORMAL LOW (ref 34.0–46.6)
Hemoglobin: 9.8 g/dL — ABNORMAL LOW (ref 11.1–15.9)
MCH: 31.3 pg (ref 26.6–33.0)
MCHC: 34.3 g/dL (ref 31.5–35.7)
MCV: 91 fL (ref 79–97)
PLATELETS: 189 10*3/uL (ref 150–379)
RBC: 3.13 x10E6/uL — AB (ref 3.77–5.28)
RDW: 13.2 % (ref 12.3–15.4)
WBC: 9 10*3/uL (ref 3.4–10.8)

## 2016-11-22 LAB — HIV ANTIBODY (ROUTINE TESTING W REFLEX): HIV SCREEN 4TH GENERATION: NONREACTIVE

## 2016-11-22 LAB — GLUCOSE TOLERANCE, 2 HOURS W/ 1HR
GLUCOSE, 1 HOUR: 136 mg/dL (ref 65–179)
GLUCOSE, FASTING: 82 mg/dL (ref 65–91)
Glucose, 2 hour: 86 mg/dL (ref 65–152)

## 2016-11-22 LAB — RPR: RPR Ser Ql: NONREACTIVE

## 2016-12-07 ENCOUNTER — Encounter: Payer: Managed Care, Other (non HMO) | Admitting: Family Medicine

## 2016-12-25 ENCOUNTER — Ambulatory Visit (INDEPENDENT_AMBULATORY_CARE_PROVIDER_SITE_OTHER): Payer: Managed Care, Other (non HMO) | Admitting: Obstetrics & Gynecology

## 2016-12-25 ENCOUNTER — Other Ambulatory Visit (HOSPITAL_COMMUNITY): Payer: Self-pay | Admitting: Obstetrics & Gynecology

## 2016-12-25 VITALS — BP 102/63 | HR 94 | Wt 133.0 lb

## 2016-12-25 DIAGNOSIS — Z3480 Encounter for supervision of other normal pregnancy, unspecified trimester: Secondary | ICD-10-CM

## 2016-12-25 NOTE — Progress Notes (Signed)
   PRENATAL VISIT NOTE  Subjective:  Darthy Manganelli is a 21 y.o. G2P1001 at [redacted]w[redacted]d being seen today for ongoing prenatal care.  She is currently monitored for the following issues for this low-risk pregnancy and has Supervision of normal pregnancy, antepartum on her problem list.  Patient reports no complaints.  Contractions: Irregular. Vag. Bleeding: None.  Movement: Present. Denies leaking of fluid.   The following portions of the patient's history were reviewed and updated as appropriate: allergies, current medications, past family history, past medical history, past social history, past surgical history and problem list. Problem list updated.  Objective:   Vitals:   12/25/16 0856  BP: 102/63  Pulse: 94  Weight: 133 lb (60.3 kg)    Fetal Status: Fetal Heart Rate (bpm): 145   Movement: Present     General:  Alert, oriented and cooperative. Patient is in no acute distress.  Skin: Skin is warm and dry. No rash noted.   Cardiovascular: Normal heart rate noted  Respiratory: Normal respiratory effort, no problems with respiration noted  Abdomen: Soft, gravid, appropriate for gestational age.  Pain/Pressure: Present     Pelvic: Cervical exam deferred        Extremities: Normal range of motion.  Edema: None  Mental Status:  Normal mood and affect. Normal behavior. Normal judgment and thought content.   Assessment and Plan:  Pregnancy: G2P1001 at [redacted]w[redacted]d  1. Supervision of other normal pregnancy, antepartum  - Culture, beta strep (group b only) - Culture, OB Urine  Preterm labor symptoms and general obstetric precautions including but not limited to vaginal bleeding, contractions, leaking of fluid and fetal movement were reviewed in detail with the patient. Please refer to After Visit Summary for other counseling recommendations.  No Follow-up on file.   Emily Filbert, MD

## 2016-12-25 NOTE — Addendum Note (Signed)
Addended by: Amado Coe on: 12/25/2016 09:11 AM   Modules accepted: Orders

## 2016-12-31 LAB — CULTURE, BETA STREP (GROUP B ONLY): Strep Gp B Culture: NEGATIVE

## 2017-01-09 ENCOUNTER — Encounter (HOSPITAL_COMMUNITY): Payer: Self-pay

## 2017-01-09 ENCOUNTER — Inpatient Hospital Stay (HOSPITAL_COMMUNITY)
Admission: AD | Admit: 2017-01-09 | Discharge: 2017-01-09 | Disposition: A | Discharge status: Home / Self Care | Payer: Managed Care, Other (non HMO) | Source: Ambulatory Visit | Attending: Obstetrics and Gynecology | Admitting: Obstetrics and Gynecology

## 2017-01-09 DIAGNOSIS — O479 False labor, unspecified: Secondary | ICD-10-CM

## 2017-01-09 DIAGNOSIS — Z3490 Encounter for supervision of normal pregnancy, unspecified, unspecified trimester: Secondary | ICD-10-CM | POA: Insufficient documentation

## 2017-01-09 DIAGNOSIS — Z3A00 Weeks of gestation of pregnancy not specified: Secondary | ICD-10-CM | POA: Diagnosis not present

## 2017-01-09 DIAGNOSIS — Z3480 Encounter for supervision of other normal pregnancy, unspecified trimester: Secondary | ICD-10-CM

## 2017-01-09 LAB — NON-GYN REPORT

## 2017-01-09 LAB — SPECIMEN STATUS REPORT

## 2017-01-09 NOTE — Discharge Instructions (Signed)

## 2017-01-10 ENCOUNTER — Ambulatory Visit (INDEPENDENT_AMBULATORY_CARE_PROVIDER_SITE_OTHER): Payer: Managed Care, Other (non HMO) | Admitting: Obstetrics & Gynecology

## 2017-01-10 VITALS — BP 100/68 | HR 109 | Wt 136.0 lb

## 2017-01-10 DIAGNOSIS — Z3480 Encounter for supervision of other normal pregnancy, unspecified trimester: Secondary | ICD-10-CM

## 2017-01-10 DIAGNOSIS — Z3483 Encounter for supervision of other normal pregnancy, third trimester: Secondary | ICD-10-CM

## 2017-01-10 NOTE — Progress Notes (Signed)
   PRENATAL VISIT NOTE  Subjective:  Holly Wilson is a 21 y.o. G2P1001 at [redacted]w[redacted]d being seen today for ongoing prenatal care.  She is currently monitored for the following issues for this low-risk pregnancy and has Supervision of normal pregnancy, antepartum on her problem list.  Patient reports no complaints.   .  .   . Denies leaking of fluid.   The following portions of the patient's history were reviewed and updated as appropriate: allergies, current medications, past family history, past medical history, past social history, past surgical history and problem list. Problem list updated.  Objective:  There were no vitals filed for this visit.  Fetal Status:           General:  Alert, oriented and cooperative. Patient is in no acute distress.  Skin: Skin is warm and dry. No rash noted.   Cardiovascular: Normal heart rate noted  Respiratory: Normal respiratory effort, no problems with respiration noted  Abdomen: Soft, gravid, appropriate for gestational age.        Pelvic: Cervical exam performed        Extremities: Normal range of motion.     Mental Status:  Normal mood and affect. Normal behavior. Normal judgment and thought content.   Assessment and Plan:  Pregnancy: G2P1001 at [redacted]w[redacted]d  1. Supervision of other normal pregnancy, antepartum   Term labor symptoms and general obstetric precautions including but not limited to vaginal bleeding, contractions, leaking of fluid and fetal movement were reviewed in detail with the patient. Please refer to After Visit Summary for other counseling recommendations.  Return in about 1 week (around 01/17/2017).   Emily Filbert, MD

## 2017-01-14 ENCOUNTER — Encounter (HOSPITAL_COMMUNITY): Payer: Self-pay

## 2017-01-14 ENCOUNTER — Inpatient Hospital Stay (HOSPITAL_COMMUNITY): Payer: Managed Care, Other (non HMO) | Admitting: Anesthesiology

## 2017-01-14 ENCOUNTER — Inpatient Hospital Stay (HOSPITAL_COMMUNITY)
Admission: AD | Admit: 2017-01-14 | Discharge: 2017-01-16 | DRG: 774 | Disposition: A | Discharge status: Home / Self Care | Payer: Managed Care, Other (non HMO) | Source: Ambulatory Visit | Attending: Obstetrics & Gynecology | Admitting: Obstetrics & Gynecology

## 2017-01-14 DIAGNOSIS — O9902 Anemia complicating childbirth: Secondary | ICD-10-CM | POA: Diagnosis present

## 2017-01-14 DIAGNOSIS — Z3A39 39 weeks gestation of pregnancy: Secondary | ICD-10-CM

## 2017-01-14 DIAGNOSIS — F1290 Cannabis use, unspecified, uncomplicated: Secondary | ICD-10-CM | POA: Diagnosis present

## 2017-01-14 DIAGNOSIS — O99324 Drug use complicating childbirth: Secondary | ICD-10-CM | POA: Diagnosis present

## 2017-01-14 DIAGNOSIS — Z880 Allergy status to penicillin: Secondary | ICD-10-CM

## 2017-01-14 DIAGNOSIS — O26893 Other specified pregnancy related conditions, third trimester: Secondary | ICD-10-CM | POA: Diagnosis present

## 2017-01-14 DIAGNOSIS — D649 Anemia, unspecified: Secondary | ICD-10-CM | POA: Diagnosis present

## 2017-01-14 DIAGNOSIS — Z3480 Encounter for supervision of other normal pregnancy, unspecified trimester: Secondary | ICD-10-CM

## 2017-01-14 LAB — CBC
HEMATOCRIT: 31.2 % — AB (ref 36.0–46.0)
HEMOGLOBIN: 10.3 g/dL — AB (ref 12.0–15.0)
MCH: 29.2 pg (ref 26.0–34.0)
MCHC: 33 g/dL (ref 30.0–36.0)
MCV: 88.4 fL (ref 78.0–100.0)
PLATELETS: 194 10*3/uL (ref 150–400)
RBC: 3.53 MIL/uL — AB (ref 3.87–5.11)
RDW: 14.2 % (ref 11.5–15.5)
WBC: 12.6 10*3/uL — AB (ref 4.0–10.5)

## 2017-01-14 LAB — RAPID URINE DRUG SCREEN, HOSP PERFORMED
AMPHETAMINES: NOT DETECTED
Barbiturates: NOT DETECTED
Benzodiazepines: NOT DETECTED
Cocaine: NOT DETECTED
OPIATES: NOT DETECTED
Tetrahydrocannabinol: NOT DETECTED

## 2017-01-14 LAB — TYPE AND SCREEN
ABO/RH(D): O POS
Antibody Screen: NEGATIVE

## 2017-01-14 LAB — ABO/RH: ABO/RH(D): O POS

## 2017-01-14 LAB — AMNISURE RUPTURE OF MEMBRANE (ROM) NOT AT ARMC: AMNISURE: POSITIVE

## 2017-01-14 LAB — POCT FERN TEST: POCT FERN TEST: POSITIVE

## 2017-01-14 MED ORDER — OXYCODONE-ACETAMINOPHEN 5-325 MG PO TABS
1.0000 | ORAL_TABLET | ORAL | Status: DC | PRN
Start: 2017-01-14 — End: 2017-01-15

## 2017-01-14 MED ORDER — OXYCODONE-ACETAMINOPHEN 5-325 MG PO TABS: 2.0000 | ORAL_TABLET | ORAL | Status: DC | PRN

## 2017-01-14 MED ORDER — OXYTOCIN BOLUS FROM INFUSION
500.0000 mL | Freq: Once | INTRAVENOUS | Status: AC
  Administered 2017-01-15: 500 mL via INTRAVENOUS

## 2017-01-14 MED ORDER — EPHEDRINE 5 MG/ML INJ
10.0000 mg | INTRAVENOUS | Status: DC | PRN
  Filled 2017-01-14: qty 2

## 2017-01-14 MED ORDER — SOD CITRATE-CITRIC ACID 500-334 MG/5ML PO SOLN
30.0000 mL | ORAL | Status: DC | PRN
  Administered 2017-01-14: 30 mL via ORAL
  Filled 2017-01-14: qty 15

## 2017-01-14 MED ORDER — ACETAMINOPHEN 325 MG PO TABS: 650.0000 mg | ORAL_TABLET | ORAL | Status: DC | PRN

## 2017-01-14 MED ORDER — PHENYLEPHRINE 40 MCG/ML (10ML) SYRINGE FOR IV PUSH (FOR BLOOD PRESSURE SUPPORT): 80.0000 ug | PREFILLED_SYRINGE | INTRAVENOUS | Status: DC | PRN

## 2017-01-14 MED ORDER — EPHEDRINE 5 MG/ML INJ: 10.0000 mg | INTRAVENOUS | Status: DC | PRN

## 2017-01-14 MED ORDER — OXYTOCIN 40 UNITS IN LACTATED RINGERS INFUSION - SIMPLE MED
1.0000 m[IU]/min | INTRAVENOUS | Status: DC
  Administered 2017-01-14: 8 m[IU]/min via INTRAVENOUS
  Administered 2017-01-14: 2 m[IU]/min via INTRAVENOUS
  Filled 2017-01-14: qty 1000

## 2017-01-14 MED ORDER — PHENYLEPHRINE 40 MCG/ML (10ML) SYRINGE FOR IV PUSH (FOR BLOOD PRESSURE SUPPORT)
80.0000 ug | PREFILLED_SYRINGE | INTRAVENOUS | Status: DC | PRN
  Filled 2017-01-14: qty 5

## 2017-01-14 MED ORDER — FENTANYL CITRATE (PF) 100 MCG/2ML IJ SOLN
100.0000 ug | INTRAMUSCULAR | Status: DC | PRN
  Administered 2017-01-14 – 2017-01-15 (×2): 100 ug via INTRAVENOUS
  Filled 2017-01-14: qty 2

## 2017-01-14 MED ORDER — FENTANYL 2.5 MCG/ML BUPIVACAINE 1/10 % EPIDURAL INFUSION (WH - ANES)
14.0000 mL/h | INTRAMUSCULAR | Status: DC | PRN
  Administered 2017-01-14: 12 mL/h via EPIDURAL
  Filled 2017-01-14: qty 100

## 2017-01-14 MED ORDER — LACTATED RINGERS IV SOLN: 500.0000 mL | Freq: Once | INTRAVENOUS | Status: DC

## 2017-01-14 MED ORDER — TERBUTALINE SULFATE 1 MG/ML IJ SOLN
0.2500 mg | Freq: Once | INTRAMUSCULAR | Status: DC | PRN
  Filled 2017-01-14: qty 1

## 2017-01-14 MED ORDER — LACTATED RINGERS IV SOLN
500.0000 mL | Freq: Once | INTRAVENOUS | Status: AC
  Administered 2017-01-15: 500 mL via INTRAVENOUS

## 2017-01-14 MED ORDER — LIDOCAINE HCL (PF) 1 % IJ SOLN
INTRAMUSCULAR | Status: DC | PRN
  Administered 2017-01-14 (×2): 5 mL via EPIDURAL

## 2017-01-14 MED ORDER — ONDANSETRON HCL 4 MG/2ML IJ SOLN
4.0000 mg | Freq: Four times a day (QID) | INTRAMUSCULAR | Status: DC | PRN
  Administered 2017-01-14: 4 mg via INTRAVENOUS
  Filled 2017-01-14: qty 2

## 2017-01-14 MED ORDER — LACTATED RINGERS IV SOLN
500.0000 mL | INTRAVENOUS | Status: DC | PRN
  Administered 2017-01-14: 1000 mL via INTRAVENOUS

## 2017-01-14 MED ORDER — DIPHENHYDRAMINE HCL 50 MG/ML IJ SOLN: 12.5000 mg | INTRAMUSCULAR | Status: DC | PRN

## 2017-01-14 MED ORDER — FLEET ENEMA 7-19 GM/118ML RE ENEM: 1.0000 | ENEMA | RECTAL | Status: DC | PRN

## 2017-01-14 MED ORDER — LIDOCAINE HCL (PF) 1 % IJ SOLN
30.0000 mL | INTRAMUSCULAR | Status: DC | PRN
  Filled 2017-01-14: qty 30

## 2017-01-14 MED ORDER — OXYTOCIN 40 UNITS IN LACTATED RINGERS INFUSION - SIMPLE MED: 2.5000 [IU]/h | INTRAVENOUS | Status: DC

## 2017-01-14 MED ORDER — PHENYLEPHRINE 40 MCG/ML (10ML) SYRINGE FOR IV PUSH (FOR BLOOD PRESSURE SUPPORT)
80.0000 ug | PREFILLED_SYRINGE | INTRAVENOUS | Status: DC | PRN
  Filled 2017-01-14: qty 5
  Filled 2017-01-14: qty 10

## 2017-01-14 MED ORDER — LACTATED RINGERS IV SOLN
INTRAVENOUS | Status: DC
  Administered 2017-01-14 (×3): via INTRAVENOUS

## 2017-01-14 NOTE — Anesthesia Procedure Notes (Signed)
Epidural Patient location during procedure: OB Start time: 01/14/2017 11:17 PM End time: 01/14/2017 11:44 PM  Staffing Anesthesiologist: Annye Asa Performed: anesthesiologist   Preanesthetic Checklist Completed: patient identified, surgical consent, pre-op evaluation, timeout performed, IV checked, risks and benefits discussed and monitors and equipment checked  Epidural Patient position: sitting Prep: site prepped and draped and DuraPrep Patient monitoring: blood pressure, continuous pulse ox and heart rate Approach: midline Location: L3-L4 Injection technique: LOR air Procedures: ultrasound guided  Needle:  Needle type: Tuohy  Needle gauge: 17 G Needle length: 9 cm Needle insertion depth: 5 cm Catheter type: closed end flexible Catheter size: 19 Gauge Catheter at skin depth: 10 cm Test dose: negative (1% lidocaine)  Assessment Events: blood not aspirated, injection not painful, no injection resistance, negative IV test and no paresthesia  Additional Notes Pt identified in Labor room.  Monitors applied. Working IV access confirmed. Sterile prep, drape lumbar spine.  1% lido local L 3,4.  #17ga Touhy LOR air at 5 cm L 3,4, cath in easily to 10 cm skin. Test dose OK, cath dosed and infusion begun.  Patient asymptomatic, VSS, no heme aspirated, tolerated well.  Jenita Seashore, MDReason for block:procedure for pain

## 2017-01-14 NOTE — H&P (Signed)
OBSTETRIC ADMISSION HISTORY AND PHYSICAL  Holly Wilson is a 21 y.o. female G2P1001 with IUP at [redacted]w[redacted]d by 13w [place] presenting for ROM @ 2100 01/13/17 then onset of increasingly stronger contractions since arrival 3 hours ago, feels them ever 2-5 minutes, but able to keep speaking during them. She reports +FMs, no VB, no blurry vision, headaches or peripheral edema, and RUQ pain.  She plans on breastfeeding. She request OCP for birth control.  She received her prenatal care at [facility]   Dated by 13w [place] not c/w reported LMP --->  Estimated Date of Delivery: 01/21/17 Anatomy US 08/29/16: [redacted]w[redacted]d, CWD, normal anatomy, cephalic presentation, size = dates  Prenatal History/Complications: Patient Active Problem List   Diagnosis Date Noted  . Normal labor 01/14/2017  . Supervision of normal pregnancy, antepartum 06/18/2016  Low-risk pregnancy  Past Medical History: Past Medical History:  Diagnosis Date  . No pertinent past medical history     Past Surgical History: Past Surgical History:  Procedure Laterality Date  . NO PAST SURGERIES      Obstetrical History: OB History    Gravida Para Term Preterm AB Living   2 1 1  0 0 1   SAB TAB Ectopic Multiple Live Births   0 0 0 0 1      Obstetric Comments   Fetal and maternal tachycardia during labor      Social History: Social History   Social History  . Marital status: Single    Spouse name: N/A  . Number of children: 1  . Years of education: N/A   Occupational History  . aesthetician    Social History Main Topics  . Smoking status: Never Smoker  . Smokeless tobacco: Never Used  . Alcohol use No  . Drug use: No  . Sexual activity: Yes    Partners: Male    Birth control/ protection: None   Other Topics Concern  . None   Social History Narrative   Sexually active   Mom- Holly Wilson to play volleyball, softball and go to [employer] actively involved      89 year old child born in 2015, daughter, Holly Wilson   Together  with father   Working as an Engineering geologist    Family History: Family History  Problem Relation Age of Onset  . Breast cancer Maternal Grandmother 55  . Birth defects Sister     Allergies: Allergies  Allergen Reactions  . Amoxicillin Rash    Has patient had a PCN reaction causing immediate rash, facial/tongue/throat swelling, SOB or lightheadedness with hypotension: No Has patient had a PCN reaction causing severe rash involving mucus membranes or skin necrosis: No Has patient had a PCN reaction that required hospitalization: No Has patient had a PCN reaction occurring within the last 10 years: No If all of the above answers are "NO", then may proceed with Cephalosporin use.     Prescriptions Prior to Admission  Medication Sig Dispense Refill Last Dose  . calcium carbonate (TUMS - DOSED IN MG ELEMENTAL CALCIUM) 500 MG chewable tablet Chew 3 tablets by mouth daily as needed for indigestion or heartburn.   01/13/2017 at Unknown time     Review of Systems  All systems reviewed and negative except as stated in HPI  Blood pressure 133/82, pulse (!) 122, temperature 98.2 F (36.8 C), resp. rate 16, last menstrual period 03/31/2016, SpO2 100 %. General appearance: alert, cooperative and no distress Lungs: clear to auscultation bilaterally Heart: regular rate  and rhythm Abdomen: soft, non-tender; bowel sounds normal Pelvic: adequate Extremities: Homans sign is negative, no sign of DVT Presentation: cephalic Fetal monitoringBaseline: 140 bpm, Variability: Good {> 6 bpm), Accelerations: Reactive and Decelerations: Absent Uterine activity: Every 2-3 minutes 1345: Dilation: 2 Effacement (%): 80 Station: -3 Exam by:: Ginger Morris RN   Prenatal labs: ABO, Rh: O/Positive/-- (03/19 1653) Antibody: Negative (03/19 1653) Rubella: 11.20 (02/12 1409) RPR: Non Reactive (07/18 0846)  HBsAg: Negative (02/12 1409)  HIV:   Non Reactive GBS:   Non Reactive 1 hr Glucose 136 Genetic  screening  normal Anatomy US normal, size = dates Pelvis proven to 7'12'' EFW by Leopold's 7.5#  Prenatal Transfer Tool  Maternal Diabetes: No Genetic Screening: Normal Maternal Ultrasounds/Referrals: Normal Fetal Ultrasounds or other Referrals:  None Maternal Substance Abuse:  Yes:  Type: Marijuana Significant Maternal Medications:  None Significant Maternal Lab Results: Lab values include: Group B Strep negative, Other: hgb 9.0 11/2016  Results for orders placed or performed during the hospital encounter of 01/14/17 (from the past 24 hour(s))  Urine rapid drug screen (hosp performed)   Collection Time: 01/14/17 12:33 PM  Result Value Ref Range   Opiates NONE DETECTED NONE DETECTED   Cocaine NONE DETECTED NONE DETECTED   Benzodiazepines NONE DETECTED NONE DETECTED   Amphetamines NONE DETECTED NONE DETECTED   Tetrahydrocannabinol NONE DETECTED NONE DETECTED   Barbiturates NONE DETECTED NONE DETECTED  POCT fern test   Collection Time: 01/14/17  1:33 PM  Result Value Ref Range   POCT Fern Test Positive = ruptured amniotic membanes   Amnisure rupture of membrane (rom)not at [facility]   Collection Time: 01/14/17  1:54 PM  Result Value Ref Range   Amnisure ROM POSITIVE   CBC   Collection Time: 01/14/17  3:13 PM  Result Value Ref Range   WBC 12.6 (H) 4.0 - 10.5 K/uL   RBC 3.53 (L) 3.87 - 5.11 MIL/uL   Hemoglobin 10.3 (L) 12.0 - 15.0 g/dL   HCT 31.2 (L) 36.0 - 46.0 %   MCV 88.4 78.0 - 100.0 fL   MCH 29.2 26.0 - 34.0 pg   MCHC 33.0 30.0 - 36.0 g/dL   RDW 14.2 11.5 - 15.5 %   Platelets 194 150 - 400 K/uL  Type and screen [place]   Collection Time: 01/14/17  3:13 PM  Result Value Ref Range   ABO/RH(D) O POS    Antibody Screen NEG    Sample Expiration 01/17/2017     Patient Active Problem List   Diagnosis Date Noted  . Supervision of normal pregnancy, antepartum 06/18/2016    Assessment/Plan:  Holly Wilson is a 22 y.o. G2P1001 at [redacted]w[redacted]d here for ROM and  early labor.  #Labor: proceed with expectant management given ctx's q2-3 minutes; consider pitocin augmentation pending clinical course #Pain: Desires epidural #FWB: Cat 1, overall reassuring #ID:  GBS negative #MOF: Breastfeeding #MOC: OCP #Circ:  Desires more information pending insurance #Anemia: 9.0 on 11/21/16; f/u repeat this admission #THC positive during pregnancy 06/2016: denies use since then, repeat UDS ordered negative #Hyperemesis gravidarum: resolved by 20w.   Seen and discussed with Dr. Gerarda Fraction.  Lockie Pares, MD  01/14/2017, 2:56 PM  OB FELLOW HISTORY AND PHYSICAL ATTESTATION  I confirm that I have verified the information documented in the resident's note and that I have also personally reperformed the physical exam and all medical decision making activities. I agree with above documentation and have made edits as needed.   [employer]  Kennen Stammer OB Fellow 01/14/2017, 7:21 PM

## 2017-01-14 NOTE — MAU Provider Note (Signed)
History     CSN: [id number]  Arrival date and time: 01/14/17 1231   First Provider Initiated Contact with Patient 01/14/17 1311      Chief Complaint  Patient presents with  . Rupture of Membranes   HPI Holly Wilson is 21 y.o. G2P1001 [redacted]w[redacted]d weeks presenting with ? Rupture of membranes.  She describes as 2 gushes of fluid wetting the couch this am.  Reports having mld contractions. Neg for vaginal bleeding or pain.  She is a patient at [facility].   Past Medical History:  Diagnosis Date  . No pertinent past medical history     Past Surgical History:  Procedure Laterality Date  . NO PAST SURGERIES      Family History  Problem Relation Age of Onset  . Breast cancer Maternal Grandmother 79  . Birth defects Sister     Social History  Substance Use Topics  . Smoking status: Never Smoker  . Smokeless tobacco: Never Used  . Alcohol use No    Allergies:  Allergies  Allergen Reactions  . Amoxicillin Rash    Has patient had a PCN reaction causing immediate rash, facial/tongue/throat swelling, SOB or lightheadedness with hypotension: No Has patient had a PCN reaction causing severe rash involving mucus membranes or skin necrosis: No Has patient had a PCN reaction that required hospitalization: No Has patient had a PCN reaction occurring within the last 10 years: No If all of the above answers are "NO", then may proceed with Cephalosporin use.     Prescriptions Prior to Admission  Medication Sig Dispense Refill Last Dose  . calcium carbonate (TUMS - DOSED IN MG ELEMENTAL CALCIUM) 500 MG chewable tablet Chew 3 tablets by mouth daily as needed for indigestion or heartburn.   01/13/2017 at Unknown time    Review of Systems  Constitutional: Negative for activity change and fever.  Gastrointestinal: Positive for abdominal pain (mild contractions).  Genitourinary: Negative for vaginal bleeding, vaginal discharge and vaginal pain.       Loss of fluid   Physical Exam   Blood  pressure 133/82, pulse (!) 122, temperature 98.2 F (36.8 C), resp. rate 16, last menstrual period 03/31/2016, SpO2 100 %.  Physical Exam  Nursing note and vitals reviewed. Constitutional: She is oriented to person, place, and time. She appears well-developed and well-nourished. No distress.  HENT:  Head: Normocephalic.  Neck: Normal range of motion.  Cardiovascular: Normal rate.   Respiratory: Effort normal.  GI: Soft. There is tenderness.  Genitourinary:  Genitourinary Comments: There is a small puddle of white thin discharge without odor.  Neg for bleeding.  Swab for fern testing collected. Cervical exam by Holly Wilson, NR>  Cervix is very high, unable to access dilation.    Neurological: She is alert and oriented to person, place, and time.  Skin: Skin is warm and dry.  Psychiatric: She has a normal mood and affect. Her behavior is normal.  States she is anxious     Results for orders placed or performed during the hospital encounter of 01/14/17 (from the past 24 hour(s))  POCT fern test     Status: None   Collection Time: 01/14/17  1:33 PM  Result Value Ref Range   POCT Fern Test Positive = ruptured amniotic membanes   Amnisure rupture of membrane (rom)not at [facility]     Status: None   Collection Time: 01/14/17  1:54 PM  Result Value Ref Range   Amnisure ROM POSITIVE     MAU Course  Procedures  MDM MSE Exam Fern testing--only 1 questionable + fern Amniosure: Positive.  NST-Baseline  140-145         Mod variability         Mild irregular contractions          1 quick variable.      13:45  Recheck cervix 2cm, 80% VERY high.      Assessment and Plan  A:  39w 0d gestation       Rupture of Membranes with questionable +Fern Test and + Amniosure          P:  Admit-report given to Dr. Gerarda Wilson.   Holly Wilson 01/14/2017, 2:38 PM

## 2017-01-14 NOTE — MAU Note (Signed)
Pt had a gush of fluid last night around 2100. Slept and then had another one this morning.

## 2017-01-14 NOTE — Anesthesia Preprocedure Evaluation (Addendum)
Anesthesia Evaluation  Patient identified by MRN, date of birth, ID band Patient awake    Reviewed: Allergy & Precautions, NPO status , Patient's Chart, lab work & pertinent test results  History of Anesthesia Complications Negative for: history of anesthetic complications  Airway Mallampati: II  TM Distance: >3 FB Neck ROM: Full    Dental  (+) Dental Advisory Given   Pulmonary neg pulmonary ROS,    breath sounds clear to auscultation       Cardiovascular negative cardio ROS   Rhythm:Regular Rate:Normal     Neuro/Psych negative neurological ROS     GI/Hepatic Neg liver ROS, GERD  ,  Endo/Other    Renal/GU negative Renal ROS     Musculoskeletal   Abdominal   Peds  Hematology Hb 10.3, plts 194k   Anesthesia Other Findings   Reproductive/Obstetrics (+) Pregnancy                            Anesthesia Physical Anesthesia Plan  ASA: II  Anesthesia Plan: Epidural   Post-op Pain Management:    Induction:   PONV Risk Score and Plan: Treatment may vary due to age or medical condition  Airway Management Planned: Natural Airway  Additional Equipment:   Intra-op Plan:   Post-operative Plan:   Informed Consent: I have reviewed the patients History and Physical, chart, labs and discussed the procedure including the risks, benefits and alternatives for the proposed anesthesia with the patient or authorized representative who has indicated his/her understanding and acceptance.   Dental advisory given  Plan Discussed with:   Anesthesia Plan Comments: (Patient identified. Risks/Benefits/Options discussed with patient including but not limited to bleeding, infection, nerve damage, paralysis, failed block, incomplete pain control, headache, blood pressure changes, nausea, vomiting, reactions to medication both or allergic, itching and postpartum back pain. Confirmed with bedside nurse the  patient's most recent platelet count. Confirmed with patient that they are not currently taking any anticoagulation, have any bleeding history or any family history of bleeding disorders. Patient expressed understanding and wished to proceed. All questions were answered. )        Anesthesia Quick Evaluation

## 2017-01-14 NOTE — Progress Notes (Signed)
Patient ID: Holly Wilson, female   DOB: [birth year], 21 y.o.   MRN: [id number]   Labor Progress Note Holly Wilson is a 21 y.o. G2P1001 at [redacted]w[redacted]d presented for SROM 2100 01/13/17.  S: FOB/husband supportive at bedside with many questions. Patient still comfortable without epidural, is feeling contractions.  O:  BP 118/74   Pulse (!) 118   Temp 98 F (36.7 C) (Oral)   Resp 20   Ht 5\' 3"  (1.6 m) Comment: Simultaneous filing. User may not have seen previous data.  Wt 63 kg (139 lb) Comment: Simultaneous filing. User may not have seen previous data.  LMP 03/31/2016   SpO2 99%   BMI 24.62 kg/m   1815: CVE: Dilation: 2 Effacement (%): 80 Cervical Position: Posterior Station: -3 Presentation: Vertex Exam by:: Ginger Morris RN   A&P: 21 y.o. G2P1001 [redacted]w[redacted]d SROM. #Labor: Unchanged over 4 hours; family amenable to pitocin augmentation. All questions answered. #Pain: Desires epidural when contractions pick up. #FWB: Cat 1, overall reassuring #GBS negative #Hx THC: UDS this admission negative.  Discussed with Dr. Gerarda Fraction.  Lockie Pares, MD 6:19 PM

## 2017-01-14 NOTE — MAU Note (Signed)
Urine in lab 

## 2017-01-15 ENCOUNTER — Telehealth: Payer: Self-pay | Admitting: Radiology

## 2017-01-15 DIAGNOSIS — Z3A39 39 weeks gestation of pregnancy: Secondary | ICD-10-CM

## 2017-01-15 LAB — CBC
HEMATOCRIT: 27.8 % — AB (ref 36.0–46.0)
Hemoglobin: 9.4 g/dL — ABNORMAL LOW (ref 12.0–15.0)
MCH: 29.7 pg (ref 26.0–34.0)
MCHC: 33.8 g/dL (ref 30.0–36.0)
MCV: 88 fL (ref 78.0–100.0)
Platelets: 148 10*3/uL — ABNORMAL LOW (ref 150–400)
RBC: 3.16 MIL/uL — ABNORMAL LOW (ref 3.87–5.11)
RDW: 13.9 % (ref 11.5–15.5)
WBC: 13.4 10*3/uL — ABNORMAL HIGH (ref 4.0–10.5)

## 2017-01-15 LAB — RPR: RPR: NONREACTIVE

## 2017-01-15 MED ORDER — COCONUT OIL OIL
1.0000 "application " | TOPICAL_OIL | Status: DC | PRN
  Filled 2017-01-15: qty 120

## 2017-01-15 MED ORDER — PRENATAL MULTIVITAMIN CH
1.0000 | ORAL_TABLET | Freq: Every day | ORAL | Status: DC
  Administered 2017-01-15 – 2017-01-16 (×2): 1 via ORAL
  Filled 2017-01-15 (×2): qty 1

## 2017-01-15 MED ORDER — DIBUCAINE 1 % RE OINT: 1.0000 "application " | TOPICAL_OINTMENT | RECTAL | Status: DC | PRN

## 2017-01-15 MED ORDER — SENNOSIDES-DOCUSATE SODIUM 8.6-50 MG PO TABS
2.0000 | ORAL_TABLET | ORAL | Status: DC
  Administered 2017-01-15: 2 via ORAL
  Filled 2017-01-15: qty 2

## 2017-01-15 MED ORDER — BENZOCAINE-MENTHOL 20-0.5 % EX AERO: 1.0000 "application " | INHALATION_SPRAY | CUTANEOUS | Status: DC | PRN

## 2017-01-15 MED ORDER — ZOLPIDEM TARTRATE 5 MG PO TABS: 5.0000 mg | ORAL_TABLET | Freq: Every evening | ORAL | Status: DC | PRN

## 2017-01-15 MED ORDER — ONDANSETRON HCL 4 MG/2ML IJ SOLN: 4.0000 mg | INTRAMUSCULAR | Status: DC | PRN

## 2017-01-15 MED ORDER — WITCH HAZEL-GLYCERIN EX PADS
1.0000 "application " | MEDICATED_PAD | CUTANEOUS | Status: DC | PRN
  Administered 2017-01-15: 1 via TOPICAL

## 2017-01-15 MED ORDER — METHYLERGONOVINE MALEATE 0.2 MG/ML IJ SOLN
0.2000 mg | Freq: Once | INTRAMUSCULAR | Status: AC
  Administered 2017-01-15: 0.2 mg via INTRAMUSCULAR

## 2017-01-15 MED ORDER — FENTANYL CITRATE (PF) 100 MCG/2ML IJ SOLN
INTRAMUSCULAR | Status: AC
  Filled 2017-01-15: qty 2

## 2017-01-15 MED ORDER — ACETAMINOPHEN 325 MG PO TABS: 650.0000 mg | ORAL_TABLET | ORAL | Status: DC | PRN

## 2017-01-15 MED ORDER — METHYLERGONOVINE MALEATE 0.2 MG/ML IJ SOLN
0.2000 mg | Freq: Once | INTRAMUSCULAR | Status: AC
  Administered 2017-01-15: 0.2 mg via INTRAMUSCULAR
  Filled 2017-01-15: qty 1

## 2017-01-15 MED ORDER — MISOPROSTOL 200 MCG PO TABS
800.0000 ug | ORAL_TABLET | Freq: Once | ORAL | Status: AC
  Administered 2017-01-15: 800 ug via RECTAL

## 2017-01-15 MED ORDER — TETANUS-DIPHTH-ACELL PERTUSSIS 5-2.5-18.5 LF-MCG/0.5 IM SUSP: 0.5000 mL | Freq: Once | INTRAMUSCULAR | Status: DC

## 2017-01-15 MED ORDER — SIMETHICONE 80 MG PO CHEW: 80.0000 mg | CHEWABLE_TABLET | ORAL | Status: DC | PRN

## 2017-01-15 MED ORDER — DIPHENHYDRAMINE HCL 25 MG PO CAPS: 25.0000 mg | ORAL_CAPSULE | Freq: Four times a day (QID) | ORAL | Status: DC | PRN

## 2017-01-15 MED ORDER — MISOPROSTOL 200 MCG PO TABS
ORAL_TABLET | ORAL | Status: AC
  Filled 2017-01-15: qty 4

## 2017-01-15 MED ORDER — OXYCODONE HCL 5 MG PO TABS
5.0000 mg | ORAL_TABLET | ORAL | Status: DC | PRN
  Administered 2017-01-15 (×2): 5 mg via ORAL
  Filled 2017-01-15 (×2): qty 1

## 2017-01-15 MED ORDER — IBUPROFEN 600 MG PO TABS
600.0000 mg | ORAL_TABLET | Freq: Four times a day (QID) | ORAL | Status: DC
  Administered 2017-01-15 – 2017-01-16 (×6): 600 mg via ORAL
  Filled 2017-01-15 (×6): qty 1

## 2017-01-15 MED ORDER — ONDANSETRON HCL 4 MG PO TABS: 4.0000 mg | ORAL_TABLET | ORAL | Status: DC | PRN

## 2017-01-15 NOTE — Progress Notes (Addendum)
Called to the bedside by RN to evaluate patient's bleeding. RN had continued to express clots after delivery and by weight, patient had lost 500 ml of blood.   Through bimanual exam I expressed another 50 ml of clots; fundus is firm but lower uterine segment boggy.  800 mcg cytotec placed and Dr. Harolyn Rutherford called to the bedside; IV  Fluid bolus given and vitals stable. Patient AO x 4.   IM methergine given; Dr. Harolyn Rutherford expressed another 50 ml of clots. Fundus still firm. Foley catheter reinserted; patient will stay on labor and delivery until stable for transfer.   Patient's status explained to patient and family; all questions answered.   Maye Hides CNM

## 2017-01-15 NOTE — Telephone Encounter (Signed)
Left voicemail on cell phone to call [facility] to schedule postpartum appointment

## 2017-01-15 NOTE — Plan of Care (Signed)
Problem: Activity: Goal: Ability to tolerate increased activity will improve Outcome: Progressing Pt encouraged to ambulate in room; has not ambulated much today.  Problem: Pain Management: Goal: General experience of comfort will improve and pain level will decrease Outcome: Progressing Pain management adequate with scheduled meds.  Pt has required additional pain meds this shift x 1.

## 2017-01-15 NOTE — Progress Notes (Signed)
   Holly Wilson is a 21 y.o. G2P1001 at [redacted]w[redacted]d  admitted for PROM  Subjective:  Patient comfortable with epidural Objective: Vitals:   01/14/17 2350 01/14/17 2355 01/15/17 0000 01/15/17 0005  BP: 113/60 (!) 111/59 119/67 115/69  Pulse: (!) 129 (!) 128 (!) 127 98  Resp:      Temp:      TempSrc:      SpO2: 99%  100% 99%  Weight:      Height:       No intake/output data recorded.  FHT:  FHR: 150 bpm, variability: moderate,  accelerations:  Present,  decelerations:  Present non-repetitive variables UC:   irregular, every 2-3 minutes SVE:   Dilation: 3.5 Effacement (%): 70 Station: -3 Exam by:: Juliane Lack, RN Pitocin 8 @ mu/min  Labs: Lab Results  Component Value Date   WBC 12.6 (H) 01/14/2017   HGB 10.3 (L) 01/14/2017   HCT 31.2 (L) 01/14/2017   MCV 88.4 01/14/2017   PLT 194 01/14/2017    Assessment / Plan: Progressing slowly, now with epidural  Labor: progressing slowly, on pitocin Fetal Wellbeing:  Category I Pain Control:  Epidural Anticipated MOD:  NSVD  Holly Wilson 01/15/2017, 12:21 AM

## 2017-01-15 NOTE — Progress Notes (Signed)
Foley cath removed .

## 2017-01-15 NOTE — Anesthesia Postprocedure Evaluation (Signed)
Anesthesia Post Note  Patient: Carliss Saber  Procedure(s) Performed: * No procedures listed *     Patient location during evaluation: Mother Baby Anesthesia Type: Epidural Level of consciousness: awake and alert and oriented Pain management: satisfactory to patient Vital Signs Assessment: post-procedure vital signs reviewed and stable Respiratory status: spontaneous breathing and nonlabored ventilation Cardiovascular status: stable Postop Assessment: no headache, no backache, no signs of nausea or vomiting, adequate PO intake and patient able to bend at knees (patient up walking) Anesthetic complications: no    Last Vitals:  Vitals:   01/15/17 0545 01/15/17 0704  BP: 130/70 123/78  Pulse: (!) 114 (!) 112  Resp: 18 16  Temp: 36.9 C 37.7 C  SpO2: 99%     Last Pain:  Vitals:   01/15/17 0705  TempSrc:   PainSc: 6    Pain Goal: Patients Stated Pain Goal: 5 (01/15/17 0030)               Willa Rough

## 2017-01-16 ENCOUNTER — Encounter (HOSPITAL_COMMUNITY): Payer: Self-pay | Admitting: *Deleted

## 2017-01-16 ENCOUNTER — Encounter: Payer: Managed Care, Other (non HMO) | Admitting: Student

## 2017-01-16 LAB — CBC
HCT: 23 % — ABNORMAL LOW (ref 36.0–46.0)
HEMOGLOBIN: 7.7 g/dL — AB (ref 12.0–15.0)
MCH: 29.6 pg (ref 26.0–34.0)
MCHC: 33.5 g/dL (ref 30.0–36.0)
MCV: 88.5 fL (ref 78.0–100.0)
Platelets: 155 10*3/uL (ref 150–400)
RBC: 2.6 MIL/uL — AB (ref 3.87–5.11)
RDW: 14.2 % (ref 11.5–15.5)
WBC: 11.3 10*3/uL — ABNORMAL HIGH (ref 4.0–10.5)

## 2017-01-16 MED ORDER — IBUPROFEN 600 MG PO TABS: 600.0000 mg | ORAL_TABLET | Freq: Four times a day (QID) | ORAL | 0 refills | Status: DC | PRN

## 2017-01-16 MED ORDER — FERROUS SULFATE 325 (65 FE) MG PO TABS
325.0000 mg | ORAL_TABLET | Freq: Two times a day (BID) | ORAL | Status: DC
  Administered 2017-01-16: 325 mg via ORAL
  Filled 2017-01-16: qty 1

## 2017-01-16 MED ORDER — FERROUS SULFATE 325 (65 FE) MG PO TABS: 325.0000 mg | ORAL_TABLET | Freq: Two times a day (BID) | ORAL | 3 refills | Status: DC

## 2017-01-16 MED ORDER — PRENATAL MULTIVITAMIN CH: 1.0000 | ORAL_TABLET | Freq: Every day | ORAL | 11 refills | Status: DC

## 2017-01-16 NOTE — Lactation Note (Addendum)
This note was copied from a baby's chart. Lactation Consultation Note Mom has 22 yr old that she BF for 2 weeks, unable to tolerate it d/t painful latches, bleeding nipples. Mom states latching doing much better w/this baby. Mom stated baby just finished BF. Encouraged to BF STS. Mom states baby having good output.  Mom has large everted nipples. Mom states baby latches fine.  Reviewed newborn behavior and feeding habits, I&O, STS, cluster feeding, supply and demand.Mom encouraged to feed baby 8-12 times/24 hours and with feeding cues.  Discussed w/mom baby had 5% weight loss. Mom stated baby had a lot of out put. has stool approx. 5 times only documented 3, has 7 voids for 28 hrs. Mom states all is well and will call for assistance if needed.  [place] brochure given w/resources, support groups and [facility] services.   Patient Name: Holly Wilson Today's Date: 01/16/2017 Reason for consult: Initial assessment   Maternal Data Has patient been taught Hand Expression?: Yes Does the patient have breastfeeding experience prior to this delivery?: Yes  Feeding Feeding Type: Breast Fed Length of feed: 20 min  LATCH Score       Type of Nipple: Everted at rest and after stimulation  Comfort (Breast/Nipple): Soft / non-tender        Interventions Interventions: Support pillows;Breast feeding basics reviewed;Position options;Skin to skin  Lactation Tools Discussed/Used     Consult Status Consult Status: Follow-up Date: 01/17/17 Follow-up type: In-patient    Theodoro Kalata 01/16/2017, 6:56 AM

## 2017-01-16 NOTE — Progress Notes (Signed)
CSW received consult for hx of marijuana use.  Referral was screened out due to the following: °~MOB had no documented substance use after initial prenatal visit/+UPT. °~MOB had no positive drug screens after initial prenatal visit/+UPT. °~Baby's UDS is negative. ° °Please consult CSW if current concerns arise or by MOB's request. ° °CSW will monitor CDS results and make report to Child Protective Services if warranted. ° °Haidyn Kilburg Boyd-Gilyard, MSW, LCSW °Clinical Social Work °[phone] ° °

## 2017-01-16 NOTE — Discharge Summary (Signed)
OB Discharge Summary     Patient Name: Holly Wilson DOB: Jan 16, 1996 MRN: [id number]  Date of admission: 01/14/2017 Delivering MD: Starr Lake   Date of discharge: 01/16/2017  Admitting diagnosis: 39WKS,WATER BREAKING Intrauterine pregnancy: [redacted]w[redacted]d     Secondary diagnosis:  Active Problems:   Normal labor  Additional problems: none     Discharge diagnosis: Term Pregnancy Delivered, Anemia and PPH                                                                                                Post partum procedures:none  Augmentation: Pitocin  Complications: PPH ~ 528UX, 82mcg cytotec, methergine x 2, evacuation of clots from [facility] course:  Onset of Labor With Vaginal Delivery     21 y.o. yo G2P1001 at [redacted]w[redacted]d was admitted in Latent Labor on 01/14/2017. Patient had an uncomplicated labor course as follows:  Membrane Rupture Time/Date: 9:00 PM ,01/13/2017   Intrapartum Procedures: Episiotomy: None [1]                                         Lacerations:  None [1]  Patient had a delivery of a Viable infant. 01/15/2017  Information for the patient's newborn:  Jomaira, Darr [[id number]]  Delivery Method: Vaginal, Spontaneous Delivery (Filed from Delivery Summary)    Pateint had an uncomplicated postpartum course.  She is ambulating, tolerating a regular diet, passing flatus, and urinating well. Patient is discharged home in stable condition on 01/16/17. Eating, drinking, voiding, ambulating well.  +flatus.  Lochia and pain wnl.  Denies dizziness, lightheadedness, or sob. No complaints. Pt desires early d/c for today.    Physical exam  Vitals:   01/15/17 0704 01/15/17 0900 01/15/17 1806 01/16/17 0556  BP: 123/78 113/68 111/60 (!) 90/44  Pulse: (!) 112 (!) 101 90 76  Resp: 16 18 17    Temp: 99.9 F (37.7 C) 98.4 F (36.9 C) 98.2 F (36.8 C) 98 F (36.7 C)  TempSrc: Oral Oral Oral Oral  SpO2:   100%   Weight:      Height:       General: alert,  cooperative and no distress Lochia: appropriate Uterine Fundus: firm Incision: N/A DVT Evaluation: No evidence of DVT seen on physical exam. Negative Homan's sign. No cords or calf tenderness. No significant calf/ankle edema.  Labs: Lab Results  Component Value Date   WBC 11.3 (H) 01/16/2017   HGB 7.7 (L) 01/16/2017   HCT 23.0 (L) 01/16/2017   MCV 88.5 01/16/2017   PLT 155 01/16/2017   Hgb 10.3>9.4>7.7  CMP Latest Ref Rng & Units 07/19/2016  Glucose 65 - 99 mg/dL 91  BUN 6 - 20 mg/dL 11  Creatinine 0.44 - 1.00 mg/dL 0.52  Sodium 135 - 145 mmol/L 135  Potassium 3.5 - 5.1 mmol/L 3.6  Chloride 101 - 111 mmol/L 102  CO2 22 - 32 mmol/L 22  Calcium 8.9 - 10.3 mg/dL 9.6  Total Protein 6.5 - 8.1 g/dL 7.8  Total Bilirubin  0.3 - 1.2 mg/dL 0.6  Alkaline Phos 38 - 126 U/L 55  AST 15 - 41 U/L 21  ALT 14 - 54 U/L 13(L)    Discharge instruction: per After Visit Summary and "Baby and Me Booklet".  After visit meds:  Allergies as of 01/16/2017      Reactions   Amoxicillin Rash   Has patient had a PCN reaction causing immediate rash, facial/tongue/throat swelling, SOB or lightheadedness with hypotension: No Has patient had a PCN reaction causing severe rash involving mucus membranes or skin necrosis: No Has patient had a PCN reaction that required hospitalization: No Has patient had a PCN reaction occurring within the last 10 years: No If all of the above answers are "NO", then may proceed with Cephalosporin use.      Medication List    STOP taking these medications   calcium carbonate 500 MG chewable tablet Commonly known as:  TUMS - dosed in mg elemental calcium     TAKE these medications   ferrous sulfate 325 (65 FE) MG tablet Take 1 tablet (325 mg total) by mouth 2 (two) times daily with a meal.   ibuprofen 600 MG tablet Commonly known as:  ADVIL,MOTRIN Take 1 tablet (600 mg total) by mouth every 6 (six) hours as needed for mild pain, moderate pain or cramping.    prenatal multivitamin Tabs tablet Take 1 tablet by mouth daily at 12 noon.            Discharge Care Instructions        Start     Ordered   01/16/17 0000  ferrous sulfate 325 (65 FE) MG tablet  2 times daily with meals    Question:  Supervising Provider  Answer:  Florian Buff   01/16/17 0625   01/16/17 0000  ibuprofen (ADVIL,MOTRIN) 600 MG tablet  Every 6 hours PRN    Question:  Supervising Provider  Answer:  Florian Buff   01/16/17 0625   01/16/17 0000  Prenatal Vit-Fe Fumarate-FA (PRENATAL MULTIVITAMIN) TABS tablet  Daily    Question:  Supervising Provider  Answer:  Florian Buff   01/16/17 1017     Asymptomatic anemia, will begin Fe BID, increase fe-rich foods, discussed symptoms to look out for, if develops sx to call clinic or come back to [facility]  Diet: routine diet  Activity: Advance as tolerated. Pelvic rest for 6 weeks.   Outpatient follow up:4wks Follow up Appt:No future appointments. Follow up Visit:No Follow-up on file.  Postpartum contraception: plans Progesterone only pills  Newborn Data: Live born female  Birth Weight: 7 lb 14.5 oz (3586 g) APGAR: 8, 9  Baby Feeding: Breast Disposition:home with mother pending d/c from peds   01/16/2017 Tawnya Crook, CNM

## 2017-01-16 NOTE — Discharge Instructions (Signed)
Postpartum Care After Vaginal Delivery The period of time right after you deliver your newborn is called the postpartum period. What kind of medical care will I receive?  You may continue to receive fluids and medicines through an IV tube inserted into one of your veins.  If an incision was made near your vagina (episiotomy) or if you had some vaginal tearing during delivery, cold compresses may be placed on your episiotomy or your tear. This helps to reduce pain and swelling.  You may be given a squirt bottle to use when you go to the bathroom. You may use this until you are comfortable wiping as usual. To use the squirt bottle, follow these steps: ? Before you urinate, fill the squirt bottle with warm water. Do not use hot water. ? After you urinate, while you are sitting on the toilet, use the squirt bottle to rinse the area around your urethra and vaginal opening. This rinses away any urine and blood. ? You may do this instead of wiping. As you start healing, you may use the squirt bottle before wiping yourself. Make sure to wipe gently. ? Fill the squirt bottle with clean water every time you use the bathroom.  You will be given sanitary pads to wear. How can I expect to feel?  You may not feel the need to urinate for several hours after delivery.  You will have some soreness and pain in your abdomen and vagina.  If you are breastfeeding, you may have uterine contractions every time you breastfeed for up to several weeks postpartum. Uterine contractions help your uterus return to its normal size.  It is normal to have vaginal bleeding (lochia) after delivery. The amount and appearance of lochia is often similar to a menstrual period in the first week after delivery. It will gradually decrease over the next few weeks to a dry, yellow-brown discharge. For most women, lochia stops completely by 6-8 weeks after delivery. Vaginal bleeding can vary from woman to woman.  Within the first few  days after delivery, you may have breast engorgement. This is when your breasts feel heavy, full, and uncomfortable. Your breasts may also throb and feel hard, tightly stretched, warm, and tender. After this occurs, you may have milk leaking from your breasts.Your health care provider can help you relieve discomfort due to breast engorgement. Breast engorgement should go away within a few days.  You may feel more sad or worried than normal due to hormonal changes after delivery. These feelings should not last more than a few days. If these feelings do not go away after several days, speak with your health care provider. How should I care for myself?  Tell your health care provider if you have pain or discomfort.  Drink enough water to keep your urine clear or pale yellow.  Wash your hands thoroughly with soap and water for at least 20 seconds after changing your sanitary pads, after using the toilet, and before holding or feeding your baby.  If you are not breastfeeding, avoid touching your breasts a lot. Doing this can make your breasts produce more milk.  If you become weak or lightheaded, or you feel like you might faint, ask for help before: ? Getting out of bed. ? Showering.  Change your sanitary pads frequently. Watch for any changes in your flow, such as a sudden increase in volume, a change in color, the passing of large blood clots. If you pass a blood clot from your vagina, save it  to show to your health care provider. Do not flush blood clots down the toilet without having your health care provider look at them.  Make sure that all your vaccinations are up to date. This can help protect you and your baby from getting certain diseases. You may need to have immunizations done before you leave the hospital.  If desired, talk with your health care provider about methods of family planning or birth control (contraception). How can I start bonding with my baby? Spending as much time as  possible with your baby is very important. During this time, you and your baby can get to know each other and develop a bond. Having your baby stay with you in your room (rooming in) can give you time to get to know your baby. Rooming in can also help you become comfortable caring for your baby. Breastfeeding can also help you bond with your baby. How can I plan for returning home with my baby?  Make sure that you have a car seat installed in your vehicle. ? Your car seat should be checked by a certified car seat installer to make sure that it is installed safely. ? Make sure that your baby fits into the car seat safely.  Ask your health care provider any questions you have about caring for yourself or your baby. Make sure that you are able to contact your health care provider with any questions after leaving the hospital. This information is not intended to replace advice given to you by your health care provider. Make sure you discuss any questions you have with your health care provider. Document Released: 02/18/2007 Document Revised: 09/26/2015 Document Reviewed: 03/28/2015 Elsevier Interactive Patient Education  2018 [employer].   Breastfeeding Deciding to breastfeed is one of the best choices you can make for you and your baby. A change in hormones during pregnancy causes your breast tissue to grow and increases the number and size of your milk ducts. These hormones also allow proteins, sugars, and fats from your blood supply to make breast milk in your milk-producing glands. Hormones prevent breast milk from being released before your baby is born as well as prompt milk flow after birth. Once breastfeeding has begun, thoughts of your baby, as well as his or her sucking or crying, can stimulate the release of milk from your milk-producing glands. Benefits of breastfeeding For Your Baby  Your first milk (colostrum) helps your baby's digestive system function better.  There are antibodies  in your milk that help your baby fight off infections.  Your baby has a lower incidence of asthma, allergies, and sudden infant death syndrome.  The nutrients in breast milk are better for your baby than infant formulas and are designed uniquely for your babys needs.  Breast milk improves your baby's brain development.  Your baby is less likely to develop other conditions, such as childhood obesity, asthma, or type 2 diabetes mellitus.  For You  Breastfeeding helps to create a very special bond between you and your baby.  Breastfeeding is convenient. Breast milk is always available at the correct temperature and costs nothing.  Breastfeeding helps to burn calories and helps you lose the weight gained during pregnancy.  Breastfeeding makes your uterus contract to its prepregnancy size faster and slows bleeding (lochia) after you give birth.  Breastfeeding helps to lower your risk of developing type 2 diabetes mellitus, osteoporosis, and breast or ovarian cancer later in life.  Signs that your baby is hungry Early Signs  of Hunger  Increased alertness or activity.  Stretching.  Movement of the head from side to side.  Movement of the head and opening of the mouth when the corner of the mouth or cheek is stroked (rooting).  Increased sucking sounds, smacking lips, cooing, sighing, or squeaking.  Hand-to-mouth movements.  Increased sucking of fingers or hands.  Late Signs of Hunger  Fussing.  Intermittent crying.  Extreme Signs of Hunger Signs of extreme hunger will require calming and consoling before your baby will be able to breastfeed successfully. Do not wait for the following signs of extreme hunger to occur before you initiate breastfeeding:  Restlessness.  A loud, strong cry.  Screaming.  Breastfeeding basics Breastfeeding Initiation  Find a comfortable place to sit or lie down, with your neck and back well supported.  Place a pillow or rolled up blanket  under your baby to bring him or her to the level of your breast (if you are seated). Nursing pillows are specially designed to help support your arms and your baby while you breastfeed.  Make sure that your baby's abdomen is facing your abdomen.  Gently massage your breast. With your fingertips, massage from your chest wall toward your nipple in a circular motion. This encourages milk flow. You may need to continue this action during the feeding if your milk flows slowly.  Support your breast with 4 fingers underneath and your thumb above your nipple. Make sure your fingers are well away from your nipple and your babys mouth.  Stroke your baby's lips gently with your finger or nipple.  When your baby's mouth is open wide enough, quickly bring your baby to your breast, placing your entire nipple and as much of the colored area around your nipple (areola) as possible into your baby's mouth. ? More areola should be visible above your baby's upper lip than below the lower lip. ? Your baby's tongue should be between his or her lower gum and your breast.  Ensure that your baby's mouth is correctly positioned around your nipple (latched). Your baby's lips should create a seal on your breast and be turned out (everted).  It is common for your baby to suck about 2-3 minutes in order to start the flow of breast milk.  Latching Teaching your baby how to latch on to your breast properly is very important. An improper latch can cause nipple pain and decreased milk supply for you and poor weight gain in your baby. Also, if your baby is not latched onto your nipple properly, he or she may swallow some air during feeding. This can make your baby fussy. Burping your baby when you switch breasts during the feeding can help to get rid of the air. However, teaching your baby to latch on properly is still the best way to prevent fussiness from swallowing air while breastfeeding. Signs that your baby has successfully  latched on to your nipple:  Silent tugging or silent sucking, without causing you pain.  Swallowing heard between every 3-4 sucks.  Muscle movement above and in front of his or her ears while sucking.  Signs that your baby has not successfully latched on to nipple:  Sucking sounds or smacking sounds from your baby while breastfeeding.  Nipple pain.  If you think your baby has not latched on correctly, slip your finger into the corner of your babys mouth to break the suction and place it between your baby's gums. Attempt breastfeeding initiation again. Signs of Successful Breastfeeding Signs from  your baby:  A gradual decrease in the number of sucks or complete cessation of sucking.  Falling asleep.  Relaxation of his or her body.  Retention of a small amount of milk in his or her mouth.  Letting go of your breast by himself or herself.  Signs from you:  Breasts that have increased in firmness, weight, and size 1-3 hours after feeding.  Breasts that are softer immediately after breastfeeding.  Increased milk volume, as well as a change in milk consistency and color by the fifth day of breastfeeding.  Nipples that are not sore, cracked, or bleeding.  Signs That Your [employer] is Getting Enough Milk  Wetting at least 1-2 diapers during the first 24 hours after birth.  Wetting at least 5-6 diapers every 24 hours for the first week after birth. The urine should be clear or pale yellow by 5 days after birth.  Wetting 6-8 diapers every 24 hours as your baby continues to grow and develop.  At least 3 stools in a 24-hour period by age 9 days. The stool should be soft and yellow.  At least 3 stools in a 24-hour period by age 37 days. The stool should be seedy and yellow.  No loss of weight greater than 10% of birth weight during the first 59 days of age.  Average weight gain of 4-7 ounces (113-198 g) per week after age 43 days.  Consistent daily weight gain by age 439 days, without  weight loss after the age of 2 weeks.  After a feeding, your baby may spit up a small amount. This is common. Breastfeeding frequency and duration Frequent feeding will help you make more milk and can prevent sore nipples and breast engorgement. Breastfeed when you feel the need to reduce the fullness of your breasts or when your baby shows signs of hunger. This is called "breastfeeding on demand." Avoid introducing a pacifier to your baby while you are working to establish breastfeeding (the first 4-6 weeks after your baby is born). After this time you may choose to use a pacifier. Research has shown that pacifier use during the first year of a baby's life decreases the risk of sudden infant death syndrome (SIDS). Allow your baby to feed on each breast as long as he or she wants. Breastfeed until your baby is finished feeding. When your baby unlatches or falls asleep while feeding from the first breast, offer the second breast. Because newborns are often sleepy in the first few weeks of life, you may need to awaken your baby to get him or her to feed. Breastfeeding times will vary from baby to baby. However, the following rules can serve as a guide to help you ensure that your baby is properly fed:  Newborns (babies 50 weeks of age or younger) may breastfeed every 1-3 hours.  Newborns should not go longer than 3 hours during the day or 5 hours during the night without breastfeeding.  You should breastfeed your baby a minimum of 8 times in a 24-hour period until you begin to introduce solid foods to your baby at around 53 months of age.  Breast milk pumping Pumping and storing breast milk allows you to ensure that your baby is exclusively fed your breast milk, even at times when you are unable to breastfeed. This is especially important if you are going back to work while you are still breastfeeding or when you are not able to be present during feedings. Your lactation consultant can give you  guidelines  on how long it is safe to store breast milk. A breast pump is a machine that allows you to pump milk from your breast into a sterile bottle. The pumped breast milk can then be stored in a refrigerator or freezer. Some breast pumps are operated by hand, while others use electricity. Ask your lactation consultant which type will work best for you. Breast pumps can be purchased, but some hospitals and breastfeeding support groups lease breast pumps on a monthly basis. A lactation consultant can teach you how to hand express breast milk, if you prefer not to use a pump. Caring for your breasts while you breastfeed Nipples can become dry, cracked, and sore while breastfeeding. The following recommendations can help keep your breasts moisturized and healthy:  Avoid using soap on your nipples.  Wear a supportive bra. Although not required, special nursing bras and tank tops are designed to allow access to your breasts for breastfeeding without taking off your entire bra or top. Avoid wearing underwire-style bras or extremely tight bras.  Air dry your nipples for 3-77mnutes after each feeding.  Use only cotton bra pads to absorb leaked breast milk. Leaking of breast milk between feedings is normal.  Use lanolin on your nipples after breastfeeding. Lanolin helps to maintain your skin's normal moisture barrier. If you use pure lanolin, you do not need to wash it off before feeding your baby again. Pure lanolin is not toxic to your baby. You may also hand express a few drops of breast milk and gently massage that milk into your nipples and allow the milk to air dry.  In the first few weeks after giving birth, some women experience extremely full breasts (engorgement). Engorgement can make your breasts feel heavy, warm, and tender to the touch. Engorgement peaks within 3-5 days after you give birth. The following recommendations can help ease engorgement:  Completely empty your breasts while breastfeeding or  pumping. You may want to start by applying warm, moist heat (in the shower or with warm water-soaked hand towels) just before feeding or pumping. This increases circulation and helps the milk flow. If your baby does not completely empty your breasts while breastfeeding, pump any extra milk after he or she is finished.  Wear a snug bra (nursing or regular) or tank top for 1-2 days to signal your body to slightly decrease milk production.  Apply ice packs to your breasts, unless this is too uncomfortable for you.  Make sure that your baby is latched on and positioned properly while breastfeeding.  If engorgement persists after 48 hours of following these recommendations, contact your health care provider or a lScience writer Overall health care recommendations while breastfeeding  Eat healthy foods. Alternate between meals and snacks, eating 3 of each per day. Because what you eat affects your breast milk, some of the foods may make your baby more irritable than usual. Avoid eating these foods if you are sure that they are negatively affecting your baby.  Drink milk, fruit juice, and water to satisfy your thirst (about 10 glasses a day).  Rest often, relax, and continue to take your prenatal vitamins to prevent fatigue, stress, and anemia.  Continue breast self-awareness checks.  Avoid chewing and smoking tobacco. Chemicals from cigarettes that pass into breast milk and exposure to secondhand smoke may harm your baby.  Avoid alcohol and drug use, including marijuana. Some medicines that may be harmful to your baby can pass through breast milk. It is important to ask  your health care provider before taking any medicine, including all over-the-counter and prescription medicine as well as vitamin and herbal supplements. It is possible to become pregnant while breastfeeding. If birth control is desired, ask your health care provider about options that will be safe for your baby. Contact a  health care provider if:  You feel like you want to stop breastfeeding or have become frustrated with breastfeeding.  You have painful breasts or nipples.  Your nipples are cracked or bleeding.  Your breasts are red, tender, or warm.  You have a swollen area on either breast.  You have a fever or chills.  You have nausea or vomiting.  You have drainage other than breast milk from your nipples.  Your breasts do not become full before feedings by the fifth day after you give birth.  You feel sad and depressed.  Your baby is too sleepy to eat well.  Your baby is having trouble sleeping.  Your baby is wetting less than 3 diapers in a 24-hour period.  Your baby has less than 3 stools in a 24-hour period.  Your baby's skin or the white part of his or her eyes becomes yellow.  Your baby is not gaining weight by 30 days of age. Get help right away if:  Your baby is overly tired (lethargic) and does not want to wake up and feed.  Your baby develops an unexplained fever. This information is not intended to replace advice given to you by your health care provider. Make sure you discuss any questions you have with your health care provider. Document Released: 04/23/2005 Document Revised: 10/05/2015 Document Reviewed: 10/15/2012 Elsevier Interactive Patient Education  2017 [employer]   Iron-Rich Diet Iron is a mineral that helps your body to produce hemoglobin. Hemoglobin is a protein in your red blood cells that carries oxygen to your body's tissues. Eating too little iron may cause you to feel weak and tired, and it can increase your risk for infection. Eating enough iron is necessary for your body's metabolism, muscle function, and nervous system. Iron is naturally found in many foods. It can also be added to foods or fortified in foods. There are two types of dietary iron:  Heme iron. Heme iron is absorbed by the body more easily than nonheme iron. Heme iron is found in meat,  poultry, and fish.  Nonheme iron. Nonheme iron is found in dietary supplements, iron-fortified grains, beans, and vegetables.  You may need to follow an iron-rich diet if:  You have been diagnosed with iron deficiency or iron-deficiency anemia.  You have a condition that prevents you from absorbing dietary iron, such as: ? Infection in your intestines. ? Celiac disease. This involves long-lasting (chronic) inflammation of your intestines.  You do not eat enough iron.  You eat a diet that is high in foods that impair iron absorption.  You have lost a lot of blood.  You have heavy bleeding during your menstrual cycle.  You are pregnant.  What is my plan? Your health care provider may help you to determine how much iron you need per day based on your condition. Generally, when a person consumes sufficient amounts of iron in the diet, the following iron needs are met:  Men. ? 15-73 years old: 11 mg per day. ? 97-23 years old: 8 mg per day.  Women. ? 76-68 years old: 15 mg per day. ? 21-22 years old: 18 mg per day. ? Over 19 years old: 8 mg per  day. ? Pregnant women: 27 mg per day. ? Breastfeeding women: 9 mg per day.  What do I need to know about an iron-rich diet?  Eat fresh fruits and vegetables that are high in vitamin C along with foods that are high in iron. This will help increase the amount of iron that your body absorbs from food, especially with foods containing nonheme iron. Foods that are high in vitamin C include oranges, peppers, tomatoes, and mango.  Take iron supplements only as directed by your health care provider. Overdose of iron can be life-threatening. If you were prescribed iron supplements, take them with orange juice or a vitamin C supplement.  Cook foods in pots and pans that are made from iron.  Eat nonheme iron-containing foods alongside foods that are high in heme iron. This helps to improve your iron absorption.  Certain foods and drinks contain  compounds that impair iron absorption. Avoid eating these foods in the same meal as iron-rich foods or with iron supplements. These include: ? Coffee, black tea, and red wine. ? Milk, dairy products, and foods that are high in calcium. ? Beans, soybeans, and peas. ? Whole grains.  When eating foods that contain both nonheme iron and compounds that impair iron absorption, follow these tips to absorb iron better. ? Soak beans overnight before cooking. ? Soak whole grains overnight and drain them before using. ? Ferment flours before baking, such as using yeast in bread dough. What foods can I eat? Grains Iron-fortified breakfast cereal. Iron-fortified whole-wheat bread. Enriched rice. Sprouted grains. Vegetables Spinach. Potatoes with skin. Green peas. Broccoli. Red and green bell peppers. Fermented vegetables. Fruits Prunes. Raisins. Oranges. Strawberries. Mango. Grapefruit. Meats and Other Protein Sources Beef liver. Oysters. Beef. Shrimp. [place]. Chicken. [name]. Sardines. Chickpeas. Nuts. Tofu. Beverages Tomato juice. Fresh orange juice. Prune juice. Hibiscus tea. Fortified instant breakfast shakes. Condiments Tahini. Fermented soy sauce. Sweets and Desserts Black-strap molasses. Other Wheat germ. The items listed above may not be a complete list of recommended foods or beverages. Contact your dietitian for more options. What foods are not recommended? Grains Whole grains. Bran cereal. Bran flour. Oats. Vegetables Artichokes. Brussels sprouts. Kale. Fruits Blueberries. Raspberries. Strawberries. Figs. Meats and Other Protein Sources Soybeans. Products made from soy protein. Dairy Milk. Cream. Cheese. Yogurt. Cottage cheese. Beverages Coffee. Black tea. Red wine. Sweets and Desserts Cocoa. Chocolate. Ice cream. Other Basil. Oregano. Parsley. The items listed above may not be a complete list of foods and beverages to avoid. Contact your dietitian for more information. This  information is not intended to replace advice given to you by your health care provider. Make sure you discuss any questions you have with your health care provider. Document Released: 12/05/2004 Document Revised: 11/11/2015 Document Reviewed: 11/18/2013 Elsevier Interactive Patient Education  [employer].

## 2017-07-02 IMAGING — US US MFM OB COMP +14 WKS
1 series · 14 of 28 positions shown · non-contrast
Comparison: none

[Series 1: us mfm ob comp +14 wks · 129 acquisitions, 14 frames shown]
[im 5/129]
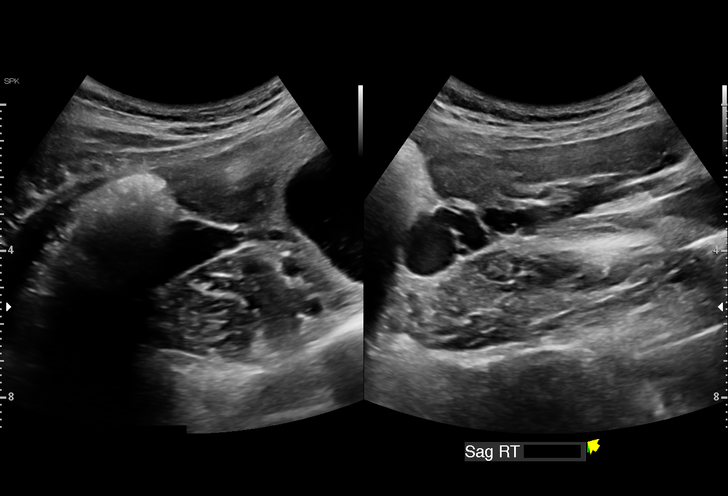
[im 15/129]
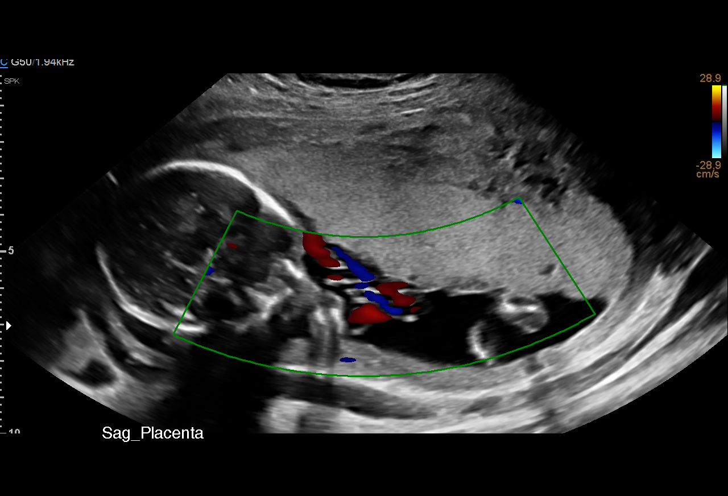
[im 24/129]
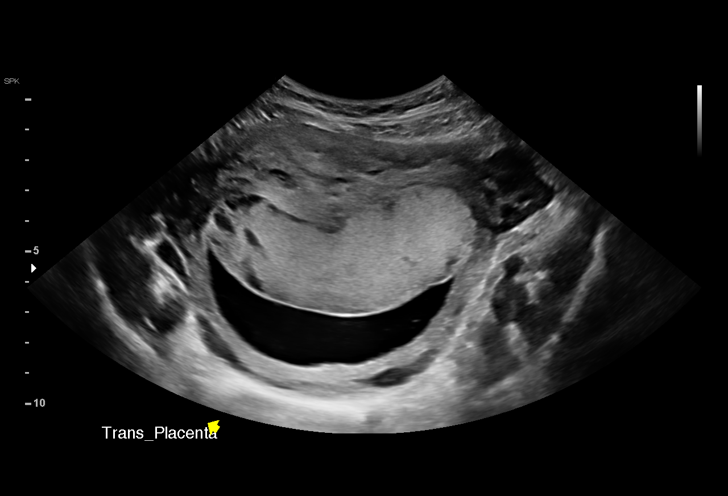
[im 34/129]
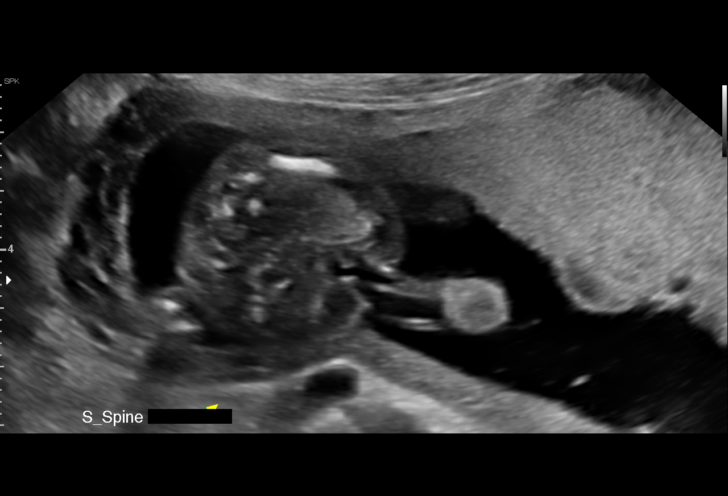
[im 43/129]
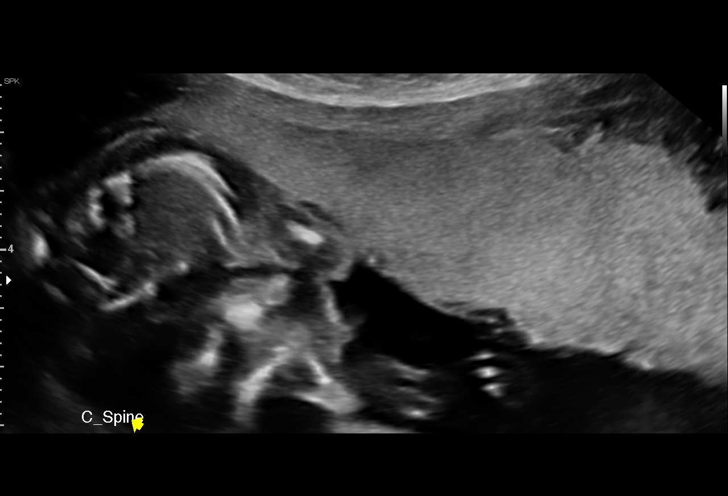
[im 53/129]
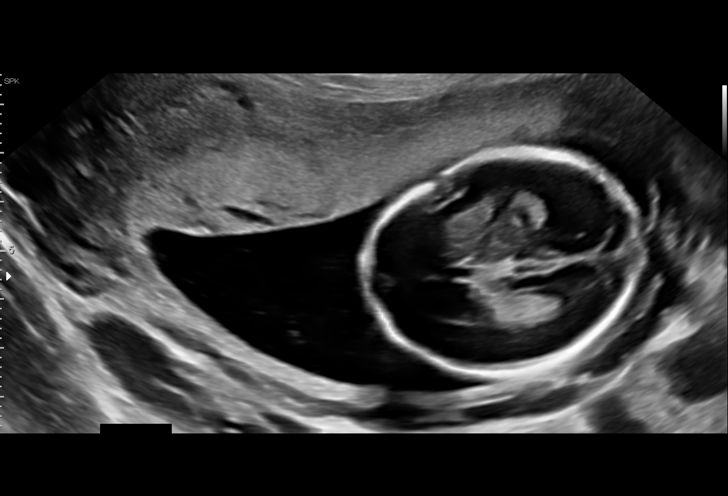
[im 62/129]
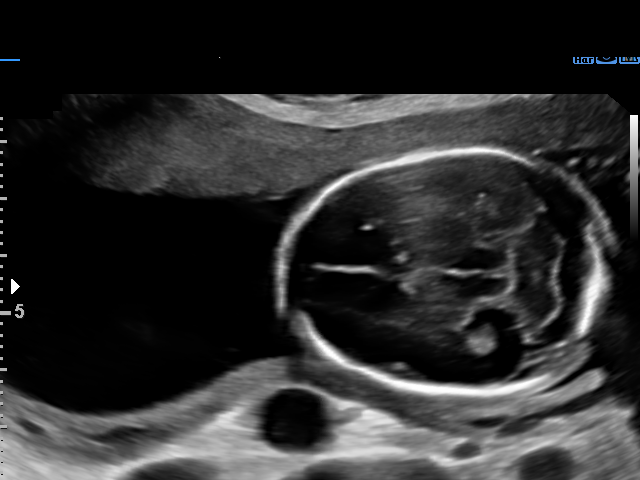
[im 72/129]
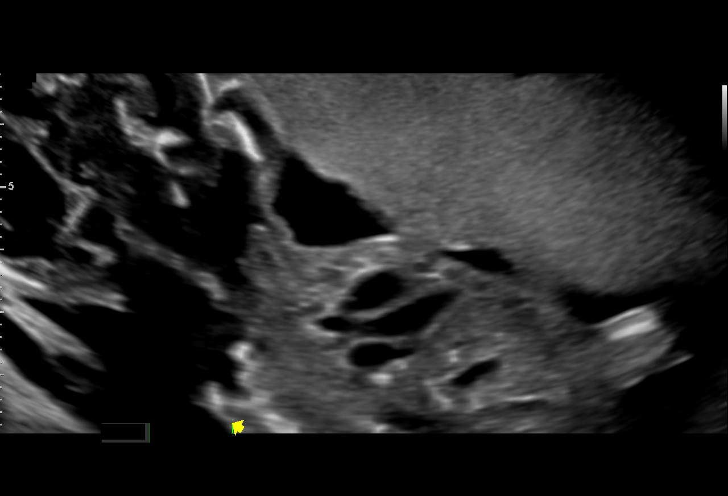
[im 81/129]
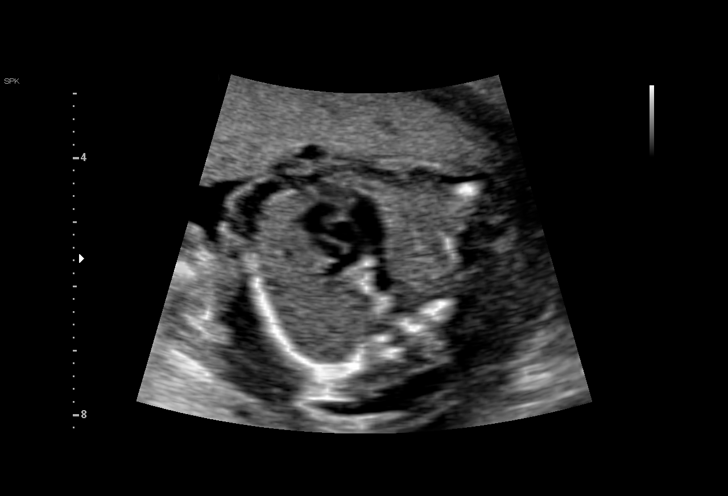
[im 91/129]
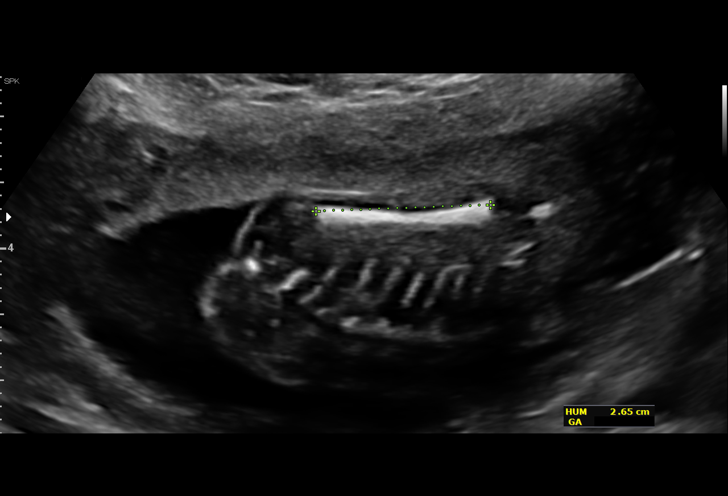
[im 100/129]
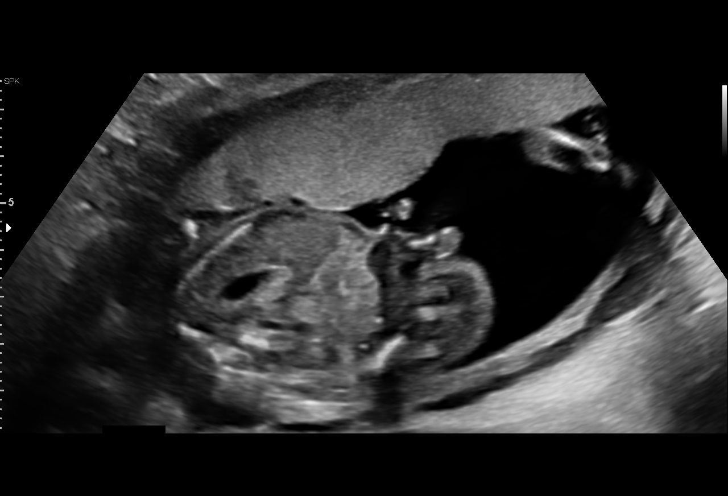
[im 110/129]
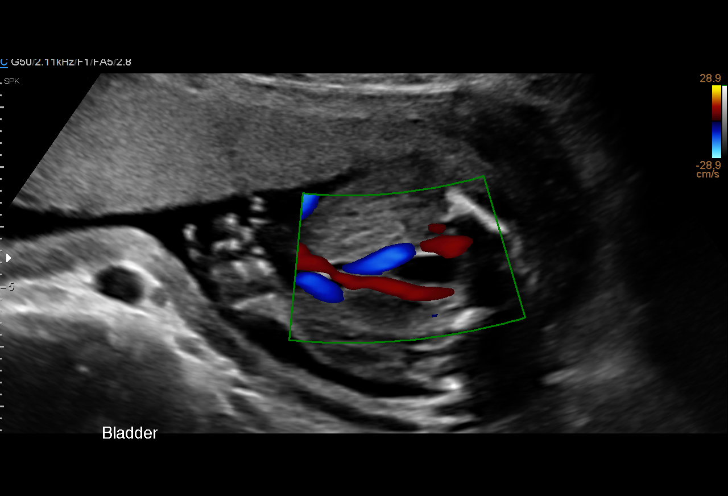
[im 119/129]
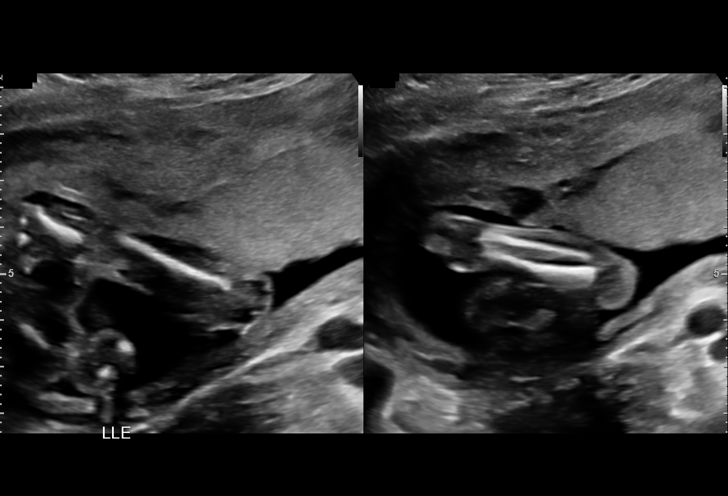
[im 129/129]
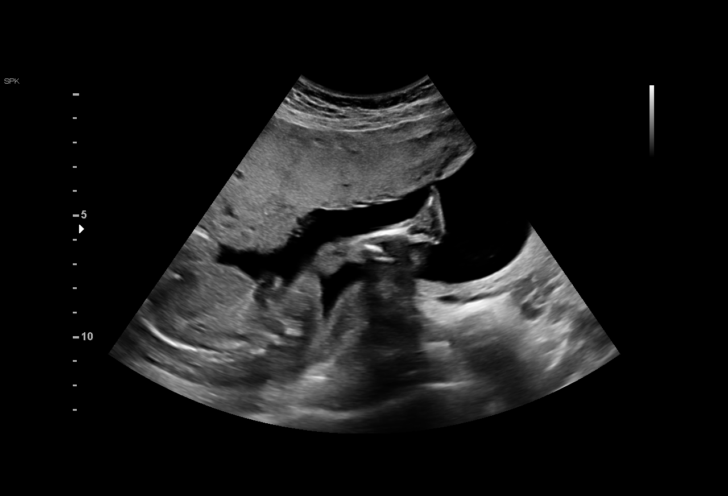

[14 of 28 positions shown; findings below may reference images not displayed]

1  PAP ZSUZSA MUSICZ              [id number]      [id number]     [id number]
Indications

19 weeks gestation of pregnancy
Encounter for antenatal screening,
unspecified
OB History

Blood Type:            Height:  5'3"   Weight (lb):  114       BMI:
Gravidity:    2         Term:   1
Living:       1
Fetal Evaluation

Num Of Fetuses:     1
Fetal Heart         149
Rate(bpm):
Cardiac Activity:   Observed
Presentation:       Variable
Placenta:           Anterior, above cervical os
P. Cord Insertion:  Visualized, central

Amniotic Fluid
AFI FV:      Subjectively within normal limits

Largest Pocket(cm)
4.2
Biometry

BPD:        42  mm     G. Age:  18w 5d         26  %    CI:        69.44   %    70 - 86
FL/HC:      17.7   %    16.1 -
HC:      160.9  mm     G. Age:  18w 6d         25  %    HC/AC:      1.17        1.09 -
AC:      137.5  mm     G. Age:  19w 1d         42  %    FL/BPD:     67.9   %
FL:       28.5  mm     G. Age:  18w 5d         25  %    FL/AC:      20.7   %    20 - 24
HUM:      27.1  mm     G. Age:  18w 4d         33  %
CER:        19  mm     G. Age:  18w 4d         29  %
NFT:       3.8  mm
CM:        4.5  mm

Est. FW:     266  gm      0 lb 9 oz     39  %
Gestational Age

LMP:           21w 4d        Date:  03/31/16                 EDD:   01/05/17
U/S Today:     18w 6d                                        EDD:   01/24/17
Best:          19w 2d     Det. By:  Early Ultrasound         EDD:   01/21/17
(06/18/16)
Anatomy

Cranium:               Appears normal         Aortic Arch:            Appears normal
Cavum:                 Appears normal         Ductal Arch:            Appears normal
Ventricles:            Appears normal         Diaphragm:              Appears normal
Choroid Plexus:        Appears normal         Stomach:                Appears normal, left
sided
Cerebellum:            Appears normal         Abdomen:                Appears normal
Posterior Fossa:       Appears normal         Abdominal Wall:         Appears nml (cord
insert, abd wall)
Nuchal Fold:           Appears normal         Cord Vessels:           Appears normal (3
vessel cord)
Face:                  Appears normal         Kidneys:                Appear normal
(orbits and profile)
Lips:                  Appears normal         Bladder:                Appears normal
Thoracic:              Appears normal         Spine:                  Appears normal
Heart:                 Appears normal         Upper Extremities:      Appears normal
(4CH, axis, and situs
RVOT:                  Appears normal         Lower Extremities:      Appears normal
LVOT:                  Appears normal

Other:  Fetus appears to be a male. Both heels and 5th digits noted
Cervix Uterus Adnexa

Cervix
Length:           3.65  cm.
Normal appearance by transabdominal scan.

Uterus
No abnormality visualized.

Left Ovary
Within normal limits.

Right Ovary
Within normal limits.

Adnexa:       No abnormality visualized.
Impression

SIUP at 93w3d on attempted comprehensive fetal survey
active singleton fetus in variable presentation
no dysmorphic features demonstrated (survey completed)
today's biometry is consistent with appropriate fetal growth
with EFW at the 39th%'le
no previa
cervix is long and closed
Recommendations

Follow up ultrasounds as clinically indicated.

## 2017-07-29 ENCOUNTER — Ambulatory Visit: Payer: Managed Care, Other (non HMO) | Admitting: Obstetrics & Gynecology

## 2017-07-29 DIAGNOSIS — Z3009 Encounter for other general counseling and advice on contraception: Secondary | ICD-10-CM

## 2017-12-16 ENCOUNTER — Ambulatory Visit: Payer: Managed Care, Other (non HMO) | Admitting: Internal Medicine

## 2017-12-16 DIAGNOSIS — Z0289 Encounter for other administrative examinations: Secondary | ICD-10-CM

## 2018-07-08 ENCOUNTER — Other Ambulatory Visit (HOSPITAL_COMMUNITY)
Admission: RE | Admit: 2018-07-08 | Discharge: 2018-07-08 | Disposition: A | Discharge status: Home / Self Care | Payer: BLUE CROSS/BLUE SHIELD | Source: Ambulatory Visit | Attending: Obstetrics & Gynecology | Admitting: Obstetrics & Gynecology

## 2018-07-08 ENCOUNTER — Encounter: Payer: Self-pay | Admitting: Obstetrics & Gynecology

## 2018-07-08 ENCOUNTER — Ambulatory Visit (INDEPENDENT_AMBULATORY_CARE_PROVIDER_SITE_OTHER): Payer: BLUE CROSS/BLUE SHIELD | Admitting: Obstetrics & Gynecology

## 2018-07-08 VITALS — BP 100/58 | HR 73 | Ht 63.0 in | Wt 123.0 lb

## 2018-07-08 DIAGNOSIS — Z3202 Encounter for pregnancy test, result negative: Secondary | ICD-10-CM | POA: Diagnosis not present

## 2018-07-08 DIAGNOSIS — Z01419 Encounter for gynecological examination (general) (routine) without abnormal findings: Secondary | ICD-10-CM

## 2018-07-08 DIAGNOSIS — Z30016 Encounter for initial prescription of transdermal patch hormonal contraceptive device: Secondary | ICD-10-CM

## 2018-07-08 LAB — POCT URINE PREGNANCY: Preg Test, Ur: NEGATIVE

## 2018-07-08 MED ORDER — NORELGESTROMIN-ETH ESTRADIOL 150-35 MCG/24HR TD PTWK: 1.0000 | MEDICATED_PATCH | TRANSDERMAL | 12 refills | Status: DC

## 2018-07-08 NOTE — Patient Instructions (Signed)
Preventive Care 18-39 Years, Female Preventive care refers to lifestyle choices and visits with your health care provider that can promote health and wellness. What does preventive care include?   A yearly physical exam. This is also called an annual well check.  Dental exams once or twice a year.  Routine eye exams. Ask your health care provider how often you should have your eyes checked.  Personal lifestyle choices, including: ? Daily care of your teeth and gums. ? Regular physical activity. ? Eating a healthy diet. ? Avoiding tobacco and drug use. ? Limiting alcohol use. ? Practicing safe sex. ? Taking vitamin and mineral supplements as recommended by your health care provider. What happens during an annual well check? The services and screenings done by your health care provider during your annual well check will depend on your age, overall health, lifestyle risk factors, and family history of disease. Counseling Your health care provider may ask you questions about your:  Alcohol use.  Tobacco use.  Drug use.  Emotional well-being.  Home and relationship well-being.  Sexual activity.  Eating habits.  Work and work environment.  Method of birth control.  Menstrual cycle.  Pregnancy history. Screening You may have the following tests or measurements:  Height, weight, and BMI.  Diabetes screening. This is done by checking your blood sugar (glucose) after you have not eaten for a while (fasting).  Blood pressure.  Lipid and cholesterol levels. These may be checked every 5 years starting at age 20.  Skin check.  Hepatitis C blood test.  Hepatitis B blood test.  Sexually transmitted disease (STD) testing.  BRCA-related cancer screening. This may be done if you have a family history of breast, ovarian, tubal, or peritoneal cancers.  Pelvic exam and Pap test. This may be done every 3 years starting at age 21. Starting at age 30, this may be done every 5  years if you have a Pap test in combination with an HPV test. Discuss your test results, treatment options, and if necessary, the need for more tests with your health care provider. Vaccines Your health care provider may recommend certain vaccines, such as:  Influenza vaccine. This is recommended every year.  Tetanus, diphtheria, and acellular pertussis (Tdap, Td) vaccine. You may need a Td booster every 10 years.  Varicella vaccine. You may need this if you have not been vaccinated.  HPV vaccine. If you are 26 or younger, you may need three doses over 6 months.  Measles, mumps, and rubella (MMR) vaccine. You may need at least one dose of MMR. You may also need a second dose.  Pneumococcal 13-valent conjugate (PCV13) vaccine. You may need this if you have certain conditions and were not previously vaccinated.  Pneumococcal polysaccharide (PPSV23) vaccine. You may need one or two doses if you smoke cigarettes or if you have certain conditions.  Meningococcal vaccine. One dose is recommended if you are age 19-21 years and a first-year college student living in a residence hall, or if you have one of several medical conditions. You may also need additional booster doses.  Hepatitis A vaccine. You may need this if you have certain conditions or if you travel or work in places where you may be exposed to hepatitis A.  Hepatitis B vaccine. You may need this if you have certain conditions or if you travel or work in places where you may be exposed to hepatitis B.  Haemophilus influenzae type b (Hib) vaccine. You may need this if you   have certain risk factors. Talk to your health care provider about which screenings and vaccines you need and how often you need them. This information is not intended to replace advice given to you by your health care provider. Make sure you discuss any questions you have with your health care provider. Document Released: 06/19/2001 Document Revised: 12/04/2016  Document Reviewed: 02/22/2015 Elsevier Interactive Patient Education  2019 [employer].

## 2018-07-08 NOTE — Progress Notes (Signed)
GYNECOLOGY ANNUAL PREVENTATIVE CARE ENCOUNTER NOTE  History:     Holly Wilson is a 23 y.o. [id number] female here for a routine annual gynecologic exam.  Current complaints: none.  Desires to restart patch for contraception, used this in the past without any concerns.   Denies abnormal vaginal bleeding, discharge, pelvic pain, problems with intercourse or other gynecologic concerns.    Gynecologic History Patient's last menstrual period was 06/20/2018 (exact date). Contraception: condoms Last Pap: 06/18/2016. Results were: normal   Obstetric History OB History  Gravida Para Term Preterm AB Living  2 1 1  0 0 1  SAB TAB Ectopic Multiple Live Births  0 0 0 0 1    # Outcome Date GA Lbr Len/2nd Weight Sex Delivery Anes PTL Lv  2 Gravida           1 Term 06/25/13 [redacted]w[redacted]d   F Vag-Spont EPI  LIV    Obstetric Comments  Fetal and maternal tachycardia during labor    Past Medical History:  Diagnosis Date  . No pertinent past medical history     Past Surgical History:  Procedure Laterality Date  . NO PAST SURGERIES      Current Outpatient Medications on File Prior to Visit  Medication Sig Dispense Refill  . ferrous sulfate 325 (65 FE) MG tablet Take 1 tablet (325 mg total) by mouth 2 (two) times daily with a meal. (Patient not taking: Reported on 07/08/2018) 60 tablet 3  . ibuprofen (ADVIL,MOTRIN) 600 MG tablet Take 1 tablet (600 mg total) by mouth every 6 (six) hours as needed for mild pain, moderate pain or cramping. (Patient not taking: Reported on 07/08/2018) 30 tablet 0  . Prenatal Vit-Fe Fumarate-FA (PRENATAL MULTIVITAMIN) TABS tablet Take 1 tablet by mouth daily at 12 noon. (Patient not taking: Reported on 07/08/2018) 30 tablet 11   No current facility-administered medications on file prior to visit.     Allergies  Allergen Reactions  . Amoxicillin Rash    Has patient had a PCN reaction causing immediate rash, facial/tongue/throat swelling, SOB or lightheadedness with  hypotension: No Has patient had a PCN reaction causing severe rash involving mucus membranes or skin necrosis: No Has patient had a PCN reaction that required hospitalization: No Has patient had a PCN reaction occurring within the last 10 years: No If all of the above answers are "NO", then may proceed with Cephalosporin use.     Social History:  reports that she has never smoked. She has never used smokeless tobacco. She reports that she does not drink alcohol or use drugs.  Family History  Problem Relation Age of Onset  . Breast cancer Maternal Grandmother 35  . Birth defects Sister     The following portions of the patient's history were reviewed and updated as appropriate: allergies, current medications, past family history, past medical history, past social history, past surgical history and problem list.  Review of Systems Pertinent items noted in HPI and remainder of comprehensive ROS otherwise negative.  Physical Exam:  BP (!) 100/58   Pulse 73   Ht 5\' 3"  (1.6 m)   Wt 123 lb (55.8 kg)   LMP 06/20/2018 (Exact Date)   BMI 21.79 kg/m  CONSTITUTIONAL: Well-developed, well-nourished female in no acute distress.  HENT:  Normocephalic, atraumatic, External right and left ear normal. Oropharynx is clear and moist EYES: Conjunctivae and EOM are normal. Pupils are equal, round, and reactive to light. No scleral icterus.  NECK: Normal range of motion, supple,  no masses.  Normal thyroid.  SKIN: Skin is warm and dry. No rash noted. Not diaphoretic. No erythema. No pallor. MUSCULOSKELETAL: Normal range of motion. No tenderness.  No cyanosis, clubbing, or edema.  2+ distal pulses. NEUROLOGIC: Alert and oriented to person, place, and time. Normal reflexes, muscle tone coordination. No cranial nerve deficit noted. PSYCHIATRIC: Normal mood and affect. Normal behavior. Normal judgment and thought content. CARDIOVASCULAR: Normal heart rate noted, regular rhythm RESPIRATORY: Clear to  auscultation bilaterally. Effort and breath sounds normal, no problems with respiration noted. BREASTS: Symmetric in size. No masses, skin changes, nipple drainage, or lymphadenopathy. ABDOMEN: Soft, normal bowel sounds, no distention noted.  No tenderness, rebound or guarding.  PELVIC: Normal appearing external genitalia; normal appearing vaginal mucosa and cervix.  No abnormal discharge noted.  Pap smear obtained.  Normal uterine size, no other palpable masses, no uterine or adnexal tenderness.   Assessment and Plan:    1. Encounter for initial prescription of transdermal patch hormonal contraceptive device Cautioned about FDA black box warning on contraceptive patches, increased risk of DVT/PE. She wants this modality, feel she will not remember to take pills daily, does not want LARCs.  Two patch samples given, also prescribed medication. Will return for contraception surveillance in about 2 months. - POCT urine pregnancy: Negative today - norelgestromin-ethinyl estradiol Marilu Favre) 150-35 MCG/24HR transdermal patch; Place 1 patch onto the skin once a week.  Dispense: 3 patch; Refill: 12  2. Well woman exam with routine gynecological exam - Cytology - PAP Will follow up results of pap smear and manage accordingly. Routine preventative health maintenance measures emphasized. Please refer to After Visit Summary for other counseling recommendations.      Verita Schneiders, MD, [place] for [employer], [place]

## 2018-07-10 LAB — CYTOLOGY - PAP
CHLAMYDIA, DNA PROBE: NEGATIVE
DIAGNOSIS: NEGATIVE
NEISSERIA GONORRHEA: NEGATIVE

## 2018-08-28 ENCOUNTER — Ambulatory Visit: Payer: Managed Care, Other (non HMO) | Admitting: Obstetrics & Gynecology

## 2019-01-13 ENCOUNTER — Other Ambulatory Visit: Payer: Self-pay

## 2019-01-13 ENCOUNTER — Telehealth: Payer: BC Managed Care – PPO | Admitting: Obstetrics & Gynecology

## 2019-01-20 ENCOUNTER — Encounter: Payer: Self-pay | Admitting: Obstetrics and Gynecology

## 2019-01-20 ENCOUNTER — Telehealth (INDEPENDENT_AMBULATORY_CARE_PROVIDER_SITE_OTHER): Payer: BC Managed Care – PPO | Admitting: Obstetrics and Gynecology

## 2019-01-20 ENCOUNTER — Other Ambulatory Visit: Payer: Self-pay

## 2019-01-20 DIAGNOSIS — Z3009 Encounter for other general counseling and advice on contraception: Secondary | ICD-10-CM | POA: Diagnosis not present

## 2019-01-20 MED ORDER — NORGESTIM-ETH ESTRAD TRIPHASIC 0.18/0.215/0.25 MG-35 MCG PO TABS: 1.0000 | ORAL_TABLET | Freq: Every day | ORAL | 3 refills | Status: DC

## 2019-01-20 NOTE — Progress Notes (Signed)
Pt would like to switch from patch to the pill   I connected with  Marcelina Morel on 01/20/19 at  9:15 AM EDT by telephone and verified that I am speaking with the correct person using two identifiers.   I discussed the limitations, risks, security and privacy concerns of performing an evaluation and management service by telephone and the availability of in person appointments. I also discussed with the patient that there may be a patient responsible charge related to this service. The patient expressed understanding and agreed to proceed.  Crosby Oyster, RN 01/20/2019  9:23 AM

## 2019-01-20 NOTE — Progress Notes (Signed)
    TELEHEALTH VIRTUAL GYNECOLOGY VISIT ENCOUNTER NOTE  I connected with Holly Wilson on 01/20/23 at  9:15 AM EDT by telephone at home and verified that I am speaking with the correct person using two identifiers.   I discussed the limitations, risks, security and privacy concerns of performing an evaluation and management service by telephone and the availability of in person appointments. I also discussed with the patient that there may be a patient responsible charge related to this service. The patient expressed understanding and agreed to proceed.  Chief Complaint: contraception management  History:  Holly Wilson is a 23 y.o. [phone] being evaluated today for desiring to switch from the patch back to the pill. She was on the pill for years with no issues and tried the patch. She doesn't like it as much as the pill. Stopped it last month. LMP two weeks ago. Used protection, last sex a week ago.      Past Medical History:  Diagnosis Date  . No pertinent past medical history    Past Surgical History:  Procedure Laterality Date  . NO PAST SURGERIES     The following portions of the patient's history were reviewed and updated as appropriate: allergies, current medications, past family history, past medical history, past social history, past surgical history and problem list.   Review of Systems:  Pertinent items noted in HPI and remainder of comprehensive ROS otherwise negative.  Physical Exam:   General:  Alert, oriented and cooperative.   Mental Status: Normal mood and affect perceived. Normal judgment and thought content.  Physical exam deferred due to nature of the encounter  Labs and Imaging No results found for this or any previous visit (from the past 336 hour(s)). No results found.    Assessment and Plan:     1. Encounter for other general counseling or advice on contraception Recommend start with next period. Tri-sprintec, which she was on before, sent in       I  discussed the assessment and treatment plan with the patient. The patient was provided an opportunity to ask questions and all were answered. The patient agreed with the plan and demonstrated an understanding of the instructions.   The patient was advised to call back or seek an in-person evaluation/go to the ED if the symptoms worsen or if the condition fails to improve as anticipated.  I provided 15 minutes of non-face-to-face time during this encounter. The visit was done via a MyChart Video visit.    Aletha Halim, MD [facility]

## 2019-04-23 ENCOUNTER — Other Ambulatory Visit: Payer: Self-pay

## 2019-04-23 ENCOUNTER — Ambulatory Visit: Payer: BC Managed Care – PPO | Attending: Internal Medicine

## 2019-04-23 ENCOUNTER — Other Ambulatory Visit: Payer: BC Managed Care – PPO

## 2019-04-23 DIAGNOSIS — Z20828 Contact with and (suspected) exposure to other viral communicable diseases: Secondary | ICD-10-CM | POA: Diagnosis not present

## 2019-04-23 DIAGNOSIS — Z20822 Contact with and (suspected) exposure to covid-19: Secondary | ICD-10-CM

## 2019-04-24 LAB — NOVEL CORONAVIRUS, NAA: SARS-CoV-2, NAA: NOT DETECTED

## 2019-05-28 ENCOUNTER — Encounter: Payer: Self-pay | Admitting: Radiology

## 2019-09-18 ENCOUNTER — Other Ambulatory Visit: Payer: Self-pay

## 2019-09-18 ENCOUNTER — Ambulatory Visit (INDEPENDENT_AMBULATORY_CARE_PROVIDER_SITE_OTHER): Payer: BC Managed Care – PPO | Admitting: Family Medicine

## 2019-09-18 ENCOUNTER — Encounter: Payer: Self-pay | Admitting: Family Medicine

## 2019-09-18 VITALS — BP 94/60 | HR 89 | Temp 98.4°F | Ht 62.5 in | Wt 122.0 lb

## 2019-09-18 DIAGNOSIS — L989 Disorder of the skin and subcutaneous tissue, unspecified: Secondary | ICD-10-CM | POA: Diagnosis not present

## 2019-09-18 NOTE — Progress Notes (Signed)
Chief Complaint  Patient presents with  . Cyst on Neck    History of Present Illness: HPI    24 year old female pt of Dr. Lillie Fragmin presents with new onset lesion on neck.    She feels likly present all her life, since child.   She reportse she has noted a lesion in her left anterior neck... occ change in size but always dereases down. No tender. No redness.   She feels well otherwise. Good energy, no SOb, no swallowing isuses.  No night sweats. No unepxected weight loss.      This visit occurred during the SARS-CoV-2 public health emergency.  Safety protocols were in place, including screening questions prior to the visit, additional usage of staff PPE, and extensive cleaning of exam room while observing appropriate contact time as indicated for disinfecting solutions.   COVID 19 screen:  No recent travel or known exposure to COVID19 The patient denies respiratory symptoms of COVID 19 at this time. The importance of social distancing was discussed today.     Review of Systems  Constitutional: Negative for chills and fever.  HENT: Negative for congestion and ear pain.   Eyes: Negative for pain and redness.  Respiratory: Negative for cough and shortness of breath.   Cardiovascular: Negative for chest pain, palpitations and leg swelling.  Gastrointestinal: Negative for abdominal pain, blood in stool, constipation, diarrhea, nausea and vomiting.  Genitourinary: Negative for dysuria.  Musculoskeletal: Negative for falls and myalgias.  Skin: Negative for rash.  Neurological: Negative for dizziness.  Psychiatric/Behavioral: Negative for depression. The patient is not nervous/anxious.       Past Medical History:  Diagnosis Date  . No pertinent past medical history     reports that she has never smoked. She has never used smokeless tobacco. She reports that she does not drink alcohol or use drugs.   Current Outpatient Medications:  .  Norgestimate-Ethinyl Estradiol  Triphasic (TRI-SPRINTEC) 0.18/0.215/0.25 MG-35 MCG tablet, Take 1 tablet by mouth daily. Start after your next period, Disp: 3 Package, Rfl: 3   Observations/Objective: Blood pressure 94/60, pulse 89, temperature 98.4 F (36.9 C), temperature source Temporal, height 5' 2.5" (1.588 m), weight 122 lb (55.3 kg), last menstrual period 08/28/2019, SpO2 98 %, unknown if currently breastfeeding.  Physical Exam Constitutional:      General: She is not in acute distress.    Appearance: Normal appearance. She is well-developed. She is not ill-appearing or toxic-appearing.  HENT:     Head: Normocephalic.     Right Ear: Hearing, tympanic membrane, ear canal and external ear normal. Tympanic membrane is not erythematous, retracted or bulging.     Left Ear: Hearing, tympanic membrane, ear canal and external ear normal. Tympanic membrane is not erythematous, retracted or bulging.     Nose: No mucosal edema or rhinorrhea.     Right Sinus: No maxillary sinus tenderness or frontal sinus tenderness.     Left Sinus: No maxillary sinus tenderness or frontal sinus tenderness.     Mouth/Throat:     Pharynx: Uvula midline.  Eyes:     General: Lids are normal. Lids are everted, no foreign bodies appreciated.     Conjunctiva/sclera: Conjunctivae normal.     Pupils: Pupils are equal, round, and reactive to light.  Neck:     Thyroid: No thyroid mass, thyromegaly or thyroid tenderness.     Vascular: No carotid bruit.     Trachea: Trachea normal.     Comments: Mobile 2 cm diameter midline  mass Cardiovascular:     Rate and Rhythm: Normal rate and regular rhythm.     Pulses: Normal pulses.     Heart sounds: Normal heart sounds, S1 normal and S2 normal. No murmur. No friction rub. No gallop.   Pulmonary:     Effort: Pulmonary effort is normal. No tachypnea or respiratory distress.     Breath sounds: Normal breath sounds. No decreased breath sounds, wheezing, rhonchi or rales.  Abdominal:     General: Bowel sounds  are normal.     Palpations: Abdomen is soft.     Tenderness: There is no abdominal tenderness.  Musculoskeletal:     Cervical back: Normal range of motion and neck supple. No rigidity. Normal range of motion.  Lymphadenopathy:     Cervical: No cervical adenopathy.  Skin:    General: Skin is warm and dry.     Findings: No rash.  Neurological:     Mental Status: She is alert.  Psychiatric:        Mood and Affect: Mood is not anxious or depressed.        Speech: Speech normal.        Behavior: Behavior normal. Behavior is cooperative.        Thought Content: Thought content normal.        Judgment: Judgment normal.      Assessment and Plan    Lesion of neck  Present miimal change for years. 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 No lymphadenopathy,  Will eval with [place]  To include eval of thyroid etc.    Eliezer Lofts, MD

## 2019-09-18 NOTE — Patient Instructions (Signed)
We will call you to set up the ultrasound.

## 2019-09-18 NOTE — Assessment & Plan Note (Addendum)
Present miimal change for years. 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 No lymphadenopathy,  Will eval with [place]  To include eval of thyroid etc.

## 2019-09-28 ENCOUNTER — Ambulatory Visit
Admission: RE | Admit: 2019-09-28 | Discharge: 2019-09-28 | Disposition: A | Discharge status: Home / Self Care | Payer: BC Managed Care – PPO | Source: Ambulatory Visit | Attending: Family Medicine | Admitting: Family Medicine

## 2019-09-28 DIAGNOSIS — L989 Disorder of the skin and subcutaneous tissue, unspecified: Secondary | ICD-10-CM

## 2019-09-28 DIAGNOSIS — R221 Localized swelling, mass and lump, neck: Secondary | ICD-10-CM | POA: Diagnosis not present

## 2020-01-22 ENCOUNTER — Other Ambulatory Visit: Payer: BC Managed Care – PPO

## 2020-01-22 ENCOUNTER — Other Ambulatory Visit: Payer: Self-pay

## 2020-01-22 DIAGNOSIS — Z20822 Contact with and (suspected) exposure to covid-19: Secondary | ICD-10-CM | POA: Diagnosis not present

## 2020-01-25 LAB — NOVEL CORONAVIRUS, NAA: SARS-CoV-2, NAA: NOT DETECTED

## 2020-04-05 NOTE — Progress Notes (Signed)
Holly Cusic T. Mcdaniel Ohms, MD, [place], [address]  Phone: [phone]  FAX: [phone]  Holly Wilson - 24 y.o. female  MRN [id number]  Date of Birth: 1996/04/05  Date: 04/06/2020  PCP: Owens Loffler, MD  Referral: Owens Loffler, MD  Chief Complaint  Patient presents with  . Contraception    Wants to restart OCP  . Anxiety/Stress    This visit occurred during the SARS-CoV-2 public health emergency.  Safety protocols were in place, including screening questions prior to the visit, additional usage of staff PPE, and extensive cleaning of exam room while observing appropriate contact time as indicated for disinfecting solutions.   Subjective:   Holly Wilson is a 24 y.o. very pleasant female patient with Body mass index is 22.23 kg/m. who presents with the following:  She is a very nice you lady who I remember from years past who is here today to talk about contraception.  Was on tri-sprintec in the past.   Check UPT. 8 days ago  Does not want to take anything else.   24 yo and 24 yo  Feels like her stress will consume her and irritate her Owns her own business  Will not like going places alone  Not depressed or sad.  More irrtiable.    Will have an acute panic attack. Not all that often.  Does have some social anxiety - no parties  No agoraphobia    Review of Systems is noted in the HPI, as appropriate  Objective:   BP 92/64   Pulse 81   Temp 98.2 F (36.8 C) (Temporal)   Ht 5' 2.5" (1.588 m)   Wt 123 lb 8 oz (56 kg)   LMP 03/29/2020   SpO2 96%   BMI 22.23 kg/m   GEN: No acute distress; alert,appropriate. PULM: Breathing comfortably in no respiratory distress PSYCH: Normally interactive.   Laboratory and Imaging Data:  Assessment and Plan:     ICD-10-CM   1. General counselling and advice on contraception  Z30.09    2. GAD (generalized anxiety disorder)  F41.1   3. Social anxiety disorder  F40.10   4. Panic attack  F41.0    Total encounter time: 30 minutes. This includes total time spent on the day of encounter.  I remember this very pleasant young lady quite well.  We do talk about some contraception today, and working to restart her on her oral contraception that she was on previously.  We also delved into some ongoing generalized anxiety which is bothering her every day.  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  She also describes significant anxiety in a social setting.  This includes large groups, party situations.  She also does have some distinct events at least once a month where she has acute onset escalation of symptoms with tachycardia, nauseousness sweaty palms and general feeling of significant unwellness.  Consistent with panic attacks.  Start with Prozac 10 mg and try to titrate up to 20 mg in the next few weeks.  Meds ordered this encounter  Medications  . FLUoxetine (PROZAC) 10 MG capsule    Sig: Start 1 cap po for 14 days, then increase to 2 caps daily    Dispense:  45 capsule    Refill:  0  . Norgestimate-Ethinyl Estradiol  Triphasic (TRI-SPRINTEC) 0.18/0.215/0.25 MG-35 MCG tablet    Sig: Take 1 tablet by mouth daily.    Dispense:  28 tablet    Refill:  11  . FLUoxetine (PROZAC) 20 MG capsule    Sig: Take 1 capsule (20 mg total) by mouth daily.    Dispense:  30 capsule    Refill:  1    refills   Medications Discontinued During This Encounter  Medication Reason  . Norgestimate-Ethinyl Estradiol Triphasic (TRI-SPRINTEC) 0.18/0.215/0.25 MG-35 MCG tablet    No orders of the defined types were placed in this encounter.   Follow-up: Return in about 6 weeks (around 05/18/2020).  Signed,  Maud Deed. Naija Troost, MD   Outpatient Encounter Medications as of 04/06/2020  Medication Sig  . FLUoxetine  (PROZAC) 10 MG capsule Start 1 cap po for 14 days, then increase to 2 caps daily  . FLUoxetine (PROZAC) 20 MG capsule Take 1 capsule (20 mg total) by mouth daily.  . Norgestimate-Ethinyl Estradiol Triphasic (TRI-SPRINTEC) 0.18/0.215/0.25 MG-35 MCG tablet Take 1 tablet by mouth daily.  . [DISCONTINUED] Norgestimate-Ethinyl Estradiol Triphasic (TRI-SPRINTEC) 0.18/0.215/0.25 MG-35 MCG tablet Take 1 tablet by mouth daily. Start after your next period (Patient not taking: Reported on 04/06/2020)   No facility-administered encounter medications on file as of 04/06/2020.

## 2020-04-06 ENCOUNTER — Other Ambulatory Visit: Payer: Self-pay

## 2020-04-06 ENCOUNTER — Ambulatory Visit (INDEPENDENT_AMBULATORY_CARE_PROVIDER_SITE_OTHER): Payer: BC Managed Care – PPO | Admitting: Family Medicine

## 2020-04-06 ENCOUNTER — Encounter: Payer: Self-pay | Admitting: Family Medicine

## 2020-04-06 VITALS — BP 92/64 | HR 81 | Temp 98.2°F | Ht 62.5 in | Wt 123.5 lb

## 2020-04-06 DIAGNOSIS — Z3009 Encounter for other general counseling and advice on contraception: Secondary | ICD-10-CM

## 2020-04-06 DIAGNOSIS — F4010 Social phobia, unspecified: Secondary | ICD-10-CM | POA: Diagnosis not present

## 2020-04-06 DIAGNOSIS — F411 Generalized anxiety disorder: Secondary | ICD-10-CM

## 2020-04-06 DIAGNOSIS — F410 Panic disorder [episodic paroxysmal anxiety] without agoraphobia: Secondary | ICD-10-CM | POA: Diagnosis not present

## 2020-04-06 HISTORY — DX: Generalized anxiety disorder: F41.1

## 2020-04-06 MED ORDER — FLUOXETINE HCL 20 MG PO CAPS: 20.0000 mg | ORAL_CAPSULE | Freq: Every day | ORAL | 1 refills | Status: DC

## 2020-04-06 MED ORDER — FLUOXETINE HCL 10 MG PO CAPS: ORAL_CAPSULE | ORAL | 0 refills | Status: DC

## 2020-04-06 MED ORDER — NORGESTIM-ETH ESTRAD TRIPHASIC 0.18/0.215/0.25 MG-35 MCG PO TABS: 1.0000 | ORAL_TABLET | Freq: Every day | ORAL | 11 refills | Status: AC

## 2020-04-06 NOTE — Patient Instructions (Signed)
Start taking Fluoxetine 10 mg each day for 2 weeks  Then increase to 20 mg for 2 weeks (refills will be 20 mg caps)

## 2020-05-15 NOTE — Progress Notes (Deleted)
    Gilmar Bua T. Dhalia Zingaro, MD, [place] [facility] [place], [address]  Phone: [phone]  FAX: [phone]  Holly Wilson - 25 y.o. female  MRN [id number]  Date of Birth: 1995/07/09  Date: 05/16/2020  PCP: Owens Loffler, MD  Referral: Owens Loffler, MD  No chief complaint on file.   This visit occurred during the SARS-CoV-2 public health emergency.  Safety protocols were in place, including screening questions prior to the visit, additional usage of staff PPE, and extensive cleaning of exam room while observing appropriate contact time as indicated for disinfecting solutions.   Subjective:   Holly Wilson is a 25 y.o. very pleasant female patient with There is no height or weight on file to calculate BMI. who presents with the following:  She is here for a follow-up after April 06, 2020 when we were discussing some generalized anxiety as well as some occasional panic attacks and some social phobia.  At that point I did start her on some Prozac, and titrated up the dose from 10 mg to 20 mg.    Review of Systems is noted in the HPI, as appropriate  Objective:   There were no vitals taken for this visit.  GEN: No acute distress; alert,appropriate. PULM: Breathing comfortably in no respiratory distress PSYCH: Normally interactive.   Laboratory and Imaging Data:  Assessment and Plan:   ***

## 2020-05-16 ENCOUNTER — Ambulatory Visit: Payer: BC Managed Care – PPO | Admitting: Family Medicine

## 2020-05-16 DIAGNOSIS — F4010 Social phobia, unspecified: Secondary | ICD-10-CM

## 2020-05-16 DIAGNOSIS — F410 Panic disorder [episodic paroxysmal anxiety] without agoraphobia: Secondary | ICD-10-CM

## 2020-05-16 DIAGNOSIS — F411 Generalized anxiety disorder: Secondary | ICD-10-CM

## 2020-07-06 ENCOUNTER — Ambulatory Visit (INDEPENDENT_AMBULATORY_CARE_PROVIDER_SITE_OTHER): Payer: BC Managed Care – PPO | Admitting: Family Medicine

## 2020-07-06 ENCOUNTER — Encounter: Payer: Self-pay | Admitting: Family Medicine

## 2020-07-06 ENCOUNTER — Telehealth: Payer: Self-pay | Admitting: Family Medicine

## 2020-07-06 ENCOUNTER — Other Ambulatory Visit: Payer: Self-pay

## 2020-07-06 VITALS — BP 100/60 | HR 94 | Temp 98.1°F | Ht 62.5 in | Wt 115.5 lb

## 2020-07-06 DIAGNOSIS — Z131 Encounter for screening for diabetes mellitus: Secondary | ICD-10-CM

## 2020-07-06 DIAGNOSIS — L659 Nonscarring hair loss, unspecified: Secondary | ICD-10-CM

## 2020-07-06 DIAGNOSIS — F4010 Social phobia, unspecified: Secondary | ICD-10-CM

## 2020-07-06 DIAGNOSIS — R221 Localized swelling, mass and lump, neck: Secondary | ICD-10-CM

## 2020-07-06 DIAGNOSIS — R634 Abnormal weight loss: Secondary | ICD-10-CM | POA: Diagnosis not present

## 2020-07-06 DIAGNOSIS — E041 Nontoxic single thyroid nodule: Secondary | ICD-10-CM | POA: Diagnosis not present

## 2020-07-06 LAB — CBC WITH DIFFERENTIAL/PLATELET
Basophils Absolute: 0 10*3/uL (ref 0.0–0.1)
Basophils Relative: 0.8 % (ref 0.0–3.0)
Eosinophils Absolute: 0.1 10*3/uL (ref 0.0–0.7)
Eosinophils Relative: 2.8 % (ref 0.0–5.0)
HCT: 38.6 % (ref 36.0–46.0)
Hemoglobin: 13.1 g/dL (ref 12.0–15.0)
Lymphocytes Relative: 35 % (ref 12.0–46.0)
Lymphs Abs: 1.7 10*3/uL (ref 0.7–4.0)
MCHC: 34 g/dL (ref 30.0–36.0)
MCV: 94.8 fl (ref 78.0–100.0)
Monocytes Absolute: 0.4 10*3/uL (ref 0.1–1.0)
Monocytes Relative: 9 % (ref 3.0–12.0)
Neutro Abs: 2.6 10*3/uL (ref 1.4–7.7)
Neutrophils Relative %: 52.4 % (ref 43.0–77.0)
Platelets: 243 10*3/uL (ref 150.0–400.0)
RBC: 4.07 Mil/uL (ref 3.87–5.11)
RDW: 13.1 % (ref 11.5–15.5)
WBC: 5 10*3/uL (ref 4.0–10.5)

## 2020-07-06 LAB — BASIC METABOLIC PANEL
BUN: 16 mg/dL (ref 6–23)
CO2: 32 mEq/L (ref 19–32)
Calcium: 9.5 mg/dL (ref 8.4–10.5)
Chloride: 102 mEq/L (ref 96–112)
Creatinine, Ser: 0.7 mg/dL (ref 0.40–1.20)
GFR: 119.81 mL/min (ref >60.00)
Glucose, Bld: 55 mg/dL — ABNORMAL LOW (ref 70–99)
Potassium: 3.8 mEq/L (ref 3.5–5.1)
Sodium: 141 mEq/L (ref 135–145)

## 2020-07-06 LAB — HEPATIC FUNCTION PANEL
ALT: 11 U/L (ref 0–35)
AST: 11 U/L (ref 0–37)
Albumin: 4.3 g/dL (ref 3.5–5.2)
Alkaline Phosphatase: 83 U/L (ref 39–117)
Bilirubin, Direct: 0.1 mg/dL (ref 0.0–0.3)
Total Bilirubin: 0.5 mg/dL (ref 0.2–1.2)
Total Protein: 6.9 g/dL (ref 6.0–8.3)

## 2020-07-06 LAB — HEMOGLOBIN A1C: Hgb A1c MFr Bld: 5.3 % (ref 4.6–6.5)

## 2020-07-06 MED ORDER — FLUOXETINE HCL 20 MG PO CAPS: 20.0000 mg | ORAL_CAPSULE | Freq: Every day | ORAL | 1 refills | Status: DC

## 2020-07-06 NOTE — Progress Notes (Signed)
Holly Wilson T. Holly Eckstein, MD, [facility] [place], [address]  Phone: [phone]  FAX: [phone]  Holly Wilson - 25 y.o. female  MRN [id number]  Date of Birth: 10-Aug-1995  Date: 07/06/2020  PCP: Holly Loffler, MD  Referral: Holly Loffler, MD  Chief Complaint  Patient presents with  . Follow-up    GAD/Prozac  . Knot on Throat    This visit occurred during the SARS-CoV-2 public health emergency.  Safety protocols were in place, including screening questions prior to the visit, additional usage of staff PPE, and extensive cleaning of exam room while observing appropriate contact time as indicated for disinfecting solutions.   Subjective:   Holly Wilson is a 25 y.o. very pleasant female patient with Body mass index is 20.79 kg/m. who presents with the following:  Anxiety is better -she has done so much better after taking her Prozac, and she is not having any significant generalized anxiety or panic attacks at this point  Question about a thyroid nodule.  She did have an abnormal ultrasound, and there was a recommendation for CT of the neck, this ultimately was not done.  She has lost a great deal of weight unintentionally and her hair has thinned out in a few places. Hair is thinning out some.  Also on th top of her head. 115 now - down from 125-130. Lost > 10% of body weight. Eating 3 meals a day and eating more.  Eating chicken, steak, etc.  Hashimoto's thyroiditis and Graves Disease - Mom has had for many years.   Patient Active Problem List   Diagnosis Date Noted  . GAD (generalized anxiety disorder) 04/06/2020  . Panic attack 04/06/2020    Past Medical History:  Diagnosis Date  . GAD (generalized anxiety disorder) 04/06/2020  . No pertinent past medical history     Past Surgical History:  Procedure Laterality Date  . NO PAST SURGERIES       Family History  Problem Relation Age of Onset  . Breast cancer Maternal Grandmother 67  . Birth defects Sister      Review of Systems is noted in the HPI, as appropriate  Objective:   BP 100/60   Pulse 94   Temp 98.1 F (36.7 C) (Temporal)   Ht 5' 2.5" (1.588 m)   Wt 115 lb 8 oz (52.4 kg)   LMP 06/29/2020   SpO2 98%   BMI 20.79 kg/m   GEN: No acute distress; alert,appropriate. PULM: Breathing comfortably in no respiratory distress PSYCH: Normally interactive.  CV: RRR, no m/g/r  PULM: Normal respiratory rate, no accessory muscle use. No wheezes, crackles or rhonchi  ENT: There is a abnormal palpable mass/nodule on the upper left anterior neck.  Laboratory and Imaging Data: CLINICAL DATA:  Lump under the chin, reportedly present since childhood. Evaluate for possible thyroglossal duct cyst.  EXAM: ULTRASOUND OF HEAD/NECK SOFT TISSUES  TECHNIQUE: Ultrasound examination of the head and neck soft tissues was performed in the area of clinical concern.  COMPARISON:  None.  FINDINGS: Ultrasound of the anterior upper neck demonstrates a heterogeneous, solid-appearing mass measuring 1.9 x 1.0 x 1.7 cm. This is likely located anterior to the hyoid bone. No clearly cystic lesion is identified.  IMPRESSION: Indeterminate 1.9 cm solid anterior neck mass. Contrast-enhanced neck CT is recommended for further evaluation.   Electronically Signed   By: [place] Wilson M.D.   On:  09/29/2019 09:59   Assessment and Plan:     ICD-10-CM   1. Neck mass  R22.1 CT Soft Tissue Neck W Contrast  2. Thyroid nodule  E04.1 T3, free    T4, free    TSH    CBC with Differential/Platelet    Hepatic function panel    CT Soft Tissue Neck W Contrast    hCG, quantitative, pregnancy  3. Weight loss  R63.4 T3, free    T4, free    TSH    CBC with Differential/Platelet    Hepatic function panel    CT Soft Tissue Neck W Contrast    hCG, quantitative, pregnancy  4. Alopecia   L65.9 T3, free    T4, free    TSH    CBC with Differential/Platelet    Hepatic function panel    CT Soft Tissue Neck W Contrast    hCG, quantitative, pregnancy  5. Screening for diabetes mellitus  T62.5 Basic metabolic panel    Hemoglobin A1c  6. Social anxiety disorder  F40.10    Obtain a CT of the neck with contrast to greater evaluate the patient's neck mass/abnormality felt on palpation as well as seen on ultrasound.  Rule out potential neoplasm and potential thyroid nodule.  Weight loss, alopecia, fatigue.  Thyroid disease would be at the top of the differential.  She does have a mother who has Hashimoto's thyroiditis.  We will begin a basic thyroid work-up.  Orders Placed This Encounter  Procedures  . CT Soft Tissue Neck W Contrast  . T3, free  . T4, free  . TSH  . Basic metabolic panel  . CBC with Differential/Platelet  . Hepatic function panel  . Hemoglobin A1c  . hCG, quantitative, pregnancy     Signed,  Haadiya Frogge T. Iley Deignan, MD   Outpatient Encounter Medications as of 07/06/2020  Medication Sig  . FLUoxetine (PROZAC) 20 MG capsule Take 1 capsule (20 mg total) by mouth daily.  . Norgestimate-Ethinyl Estradiol Triphasic (TRI-SPRINTEC) 0.18/0.215/0.25 MG-35 MCG tablet Take 1 tablet by mouth daily.  . [DISCONTINUED] FLUoxetine (PROZAC) 10 MG capsule Start 1 cap po for 14 days, then increase to 2 caps daily   No facility-administered encounter medications on file as of 07/06/2020.

## 2020-07-06 NOTE — Telephone Encounter (Signed)
Refills sent to [facility] in [place] to call patient but her voicemail was full so I was unable to leave a message.

## 2020-07-06 NOTE — Telephone Encounter (Signed)
Patient went to pharmacy and her medicine was not sent in. Please advise. EM Prozac in [place]

## 2020-07-07 LAB — T4, FREE: Free T4: 0.8 ng/dL (ref 0.60–1.60)

## 2020-07-07 LAB — HCG, QUANTITATIVE, PREGNANCY: Quantitative HCG: <0.6 m[IU]/mL

## 2020-07-07 LAB — TSH: TSH: 1.2 u[IU]/mL (ref 0.35–4.50)

## 2020-07-07 LAB — T3, FREE: T3, Free: 3.6 pg/mL (ref 2.3–4.2)

## 2020-07-18 ENCOUNTER — Telehealth: Payer: Self-pay

## 2020-07-18 NOTE — Telephone Encounter (Signed)
[facility] Night - Client Nonclinical Telephone Record  [facility] Client [facility] Night - Client Client Site [place] Physician Owens Loffler - MD Contact Type Call Who Is Calling Physician / Provider / Hospital Call Type Provider Call Message Only Reason for Call Request to send message to Office Initial Comment Caller is a nurse who has patient Shanavia Fonte who is trying to redirect a CT to in network provider. Additional Comment Provided office hours. Disp. Time Disposition Final User 07/15/2020 5:45:33 PM General Information Provided Yes Uvaldo Rising Call Closed By: Uvaldo Rising Transaction Date/Time: 07/15/2020 5:40:46 PM (ET)

## 2020-07-21 ENCOUNTER — Ambulatory Visit
Admission: RE | Admit: 2020-07-21 | Discharge: 2020-07-21 | Disposition: A | Discharge status: Home / Self Care | Payer: BC Managed Care – PPO | Source: Ambulatory Visit | Attending: Family Medicine | Admitting: Family Medicine

## 2020-07-21 ENCOUNTER — Other Ambulatory Visit: Payer: Self-pay

## 2020-07-21 DIAGNOSIS — L989 Disorder of the skin and subcutaneous tissue, unspecified: Secondary | ICD-10-CM | POA: Diagnosis not present

## 2020-07-21 DIAGNOSIS — R221 Localized swelling, mass and lump, neck: Secondary | ICD-10-CM

## 2020-07-21 DIAGNOSIS — E041 Nontoxic single thyroid nodule: Secondary | ICD-10-CM

## 2020-07-21 DIAGNOSIS — L659 Nonscarring hair loss, unspecified: Secondary | ICD-10-CM

## 2020-07-21 DIAGNOSIS — R634 Abnormal weight loss: Secondary | ICD-10-CM

## 2020-07-21 MED ORDER — IOPAMIDOL (ISOVUE-300) INJECTION 61%
75.0000 mL | Freq: Once | INTRAVENOUS | Status: AC | PRN
  Administered 2020-07-21: 75 mL via INTRAVENOUS

## 2020-07-27 ENCOUNTER — Other Ambulatory Visit: Payer: Self-pay | Admitting: Family Medicine

## 2020-07-27 DIAGNOSIS — D367 Benign neoplasm of other specified sites: Secondary | ICD-10-CM

## 2020-07-27 DIAGNOSIS — R221 Localized swelling, mass and lump, neck: Secondary | ICD-10-CM

## 2020-07-27 NOTE — Progress Notes (Signed)
CT Soft Tissue Neck W Contrast  Result Date: 07/21/2020 CLINICAL DATA:  Neck mass EXAM: CT NECK WITH CONTRAST TECHNIQUE: Multidetector CT imaging of the neck was performed using the standard protocol following the bolus administration of intravenous contrast. CONTRAST:  74mL ISOVUE-300 IOPAMIDOL (ISOVUE-300) INJECTION 61% COMPARISON:  None. FINDINGS: Pharynx and larynx: Unremarkable.  No mass or swelling. Salivary glands: Unremarkable. Thyroid: Unremarkable. Lymph nodes: No enlarged or abnormal density nodes. Vascular: Major neck vessels are patent. Limited intracranial: No abnormal enhancement. Visualized orbits: Unremarkable. Mastoids and visualized paranasal sinuses: No significant opacification. Skeleton: No significant abnormality. Upper chest: Included upper lungs are clear. Other: Anterior neck skin marker overlies encapsulated fat density lesion eccentric to the left measuring 1.3 x 2.2 x 1.6 cm. The mass is anterior to and extends below the level of the hyoid. 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 Lipoma is a differential consideration. Electronically Signed   By: Macy Mis M.D.   On: 07/21/2020 12:27

## 2020-08-16 DIAGNOSIS — R221 Localized swelling, mass and lump, neck: Secondary | ICD-10-CM | POA: Diagnosis not present

## 2020-09-12 MED ORDER — FLUOXETINE HCL 10 MG PO CAPS: 20.0000 mg | ORAL_CAPSULE | Freq: Every day | ORAL | 1 refills | Status: DC

## 2020-10-19 ENCOUNTER — Other Ambulatory Visit: Payer: Self-pay | Admitting: Family Medicine

## 2020-11-04 ENCOUNTER — Other Ambulatory Visit: Payer: Self-pay

## 2020-11-04 ENCOUNTER — Other Ambulatory Visit
Admission: RE | Admit: 2020-11-04 | Discharge: 2020-11-04 | Disposition: A | Discharge status: Home / Self Care | Payer: Medicaid Other | Source: Ambulatory Visit | Attending: Unknown Physician Specialty | Admitting: Unknown Physician Specialty

## 2020-11-04 NOTE — Patient Instructions (Signed)
Your procedure is scheduled on: Tuesday November 15, 2020  Report to Day Surgery inside [facility] 2nd floor (stop by admissions desk first before getting on elevator) To find out your arrival time please call [phone] between 1PM - 3PM on Monday November 14, 2020.  Remember: Instructions that are not followed completely may result in serious medical risk,  up to and including death, or upon the discretion of your surgeon and anesthesiologist your  surgery may need to be rescheduled.     _X__ 1. Do not eat food after midnight the night before your procedure.                 No chewing gum or hard candies. You may drink clear liquids up to 2 hours                 before you are scheduled to arrive for your surgery- DO not drink clear                 liquids within 2 hours of the start of your surgery.                 Clear Liquids include:  water, apple juice without pulp, clear Gatorade, G2 or                  Gatorade Zero (avoid Red/Purple/Blue), Black Coffee or Tea (Do not add                 anything to coffee or tea).  __X__2.  On the morning of surgery brush your teeth with toothpaste and water, you                may rinse your mouth with mouthwash if you wish.  Do not swallow any toothpaste of mouthwash.     _X__ 3.  No Alcohol for 24 hours before or after surgery.   _X__ 4.  Do Not Smoke or use e-cigarettes For 24 Hours Prior to Your Surgery.                 Do not use any chewable tobacco products for at least 6 hours prior to                 Surgery.  _X__  5.  Do not use any recreational drugs (marijuana, cocaine, heroin, ecstasy, MDMA or other)                For at least one week prior to your surgery.  Combination of these drugs with anesthesia                May have life threatening results.  __X__6.  Notify your doctor if there is any change in your medical condition      (cold, fever, infections).     Do not wear jewelry, make-up, hairpins,  clips or nail polish. Do not wear lotions, powders, or perfumes. You may wear deodorant. Do not shave 48 hours prior to surgery.  Do not bring valuables to the hospital.    [facility] is not responsible for any belongings or valuables.  Contacts, dentures or bridgework may not be worn into surgery. Leave your suitcase in the car. After surgery it may be brought to your room. For patients admitted to the hospital, discharge time is determined by your treatment team.   Patients discharged the day of surgery will not be allowed to drive home.   Make arrangements  for someone to be with you for the first 24 hours of your Same Day Discharge.   __X__ Take these medicines the morning of surgery with A SIP OF WATER:    1. None   2.   3.   4.  5.  6.  ____ Fleet Enema (as directed)   ____ Use CHG Soap (or wipes) as directed  ____ Use Benzoyl Peroxide Gel as instructed  ____ Use inhalers on the day of surgery  ____ Stop metformin 2 days prior to surgery    ____ Take 1/2 of usual insulin dose the night before surgery. No insulin the morning          of surgery.   ____ Call your PCP, cardiologist, or Pulmonologist if taking Coumadin/Plavix/aspirin and ask when to stop before your surgery.   __X__ One Week prior to surgery- Stop Anti-inflammatories such as Ibuprofen, Aleve, Advil, Motrin, meloxicam (MOBIC), diclofenac, etodolac, ketorolac, Toradol, Daypro, piroxicam, Goody's or BC powders. OK TO USE TYLENOL IF NEEDED   __X__ Stop supplements until after surgery.    ____ Bring C-Pap to the hospital.    If you have any questions regarding your pre-procedure instructions,  Please call Pre-admit Testing at [phone].

## 2020-11-11 ENCOUNTER — Other Ambulatory Visit: Payer: Medicaid Other

## 2020-11-15 ENCOUNTER — Ambulatory Visit: Payer: Medicaid Other | Admitting: Certified Registered"

## 2020-11-15 ENCOUNTER — Ambulatory Visit
Admission: RE | Admit: 2020-11-15 | Discharge: 2020-11-15 | Disposition: A | Discharge status: Home / Self Care | Payer: Medicaid Other | Attending: Unknown Physician Specialty | Admitting: Unknown Physician Specialty

## 2020-11-15 ENCOUNTER — Other Ambulatory Visit: Payer: Self-pay

## 2020-11-15 ENCOUNTER — Encounter
Admission: RE | Disposition: A | Discharge status: Home / Self Care | Payer: Self-pay | Source: Home / Self Care | Attending: Unknown Physician Specialty

## 2020-11-15 ENCOUNTER — Encounter: Payer: Self-pay | Admitting: Unknown Physician Specialty

## 2020-11-15 DIAGNOSIS — M8568 Other cyst of bone, other site: Secondary | ICD-10-CM | POA: Diagnosis not present

## 2020-11-15 DIAGNOSIS — Z79899 Other long term (current) drug therapy: Secondary | ICD-10-CM | POA: Diagnosis not present

## 2020-11-15 DIAGNOSIS — R221 Localized swelling, mass and lump, neck: Secondary | ICD-10-CM | POA: Diagnosis present

## 2020-11-15 HISTORY — PX: THYROGLOSSAL DUCT CYST: SHX297

## 2020-11-15 LAB — POCT PREGNANCY, URINE: Preg Test, Ur: NEGATIVE

## 2020-11-15 SURGERY — EXCISION, THYROGLOSSAL DUCT CYST
Anesthesia: General

## 2020-11-15 MED ORDER — ACETAMINOPHEN 10 MG/ML IV SOLN
INTRAVENOUS | Status: AC
  Filled 2020-11-15: qty 100

## 2020-11-15 MED ORDER — FAMOTIDINE 20 MG PO TABS: 20.0000 mg | ORAL_TABLET | Freq: Once | ORAL | Status: AC

## 2020-11-15 MED ORDER — ONDANSETRON HCL 4 MG/2ML IJ SOLN
INTRAMUSCULAR | Status: DC | PRN
  Administered 2020-11-15: 4 mg via INTRAVENOUS

## 2020-11-15 MED ORDER — DEXAMETHASONE SODIUM PHOSPHATE 10 MG/ML IJ SOLN
INTRAMUSCULAR | Status: DC | PRN
  Administered 2020-11-15: 10 mg via INTRAVENOUS

## 2020-11-15 MED ORDER — LIDOCAINE-EPINEPHRINE 1 %-1:100000 IJ SOLN
INTRAMUSCULAR | Status: DC | PRN
  Administered 2020-11-15: 4 mL

## 2020-11-15 MED ORDER — FENTANYL CITRATE (PF) 100 MCG/2ML IJ SOLN: 25.0000 ug | INTRAMUSCULAR | Status: DC | PRN

## 2020-11-15 MED ORDER — HYDROCODONE-ACETAMINOPHEN 5-300 MG PO TABS: 1.0000 | ORAL_TABLET | ORAL | 0 refills | Status: AC | PRN

## 2020-11-15 MED ORDER — PHENYLEPHRINE HCL (PRESSORS) 10 MG/ML IV SOLN
INTRAVENOUS | Status: AC
  Filled 2020-11-15: qty 1

## 2020-11-15 MED ORDER — MIDAZOLAM HCL 2 MG/2ML IJ SOLN
INTRAMUSCULAR | Status: DC | PRN
  Administered 2020-11-15: 2 mg via INTRAVENOUS

## 2020-11-15 MED ORDER — FAMOTIDINE 20 MG PO TABS
ORAL_TABLET | ORAL | Status: AC
  Administered 2020-11-15: 20 mg via ORAL
  Filled 2020-11-15: qty 1

## 2020-11-15 MED ORDER — BACITRACIN ZINC 500 UNIT/GM EX OINT
TOPICAL_OINTMENT | CUTANEOUS | Status: AC
  Filled 2020-11-15: qty 28.35

## 2020-11-15 MED ORDER — 0.9 % SODIUM CHLORIDE (POUR BTL) OPTIME
TOPICAL | Status: DC | PRN
  Administered 2020-11-15: 500 mL

## 2020-11-15 MED ORDER — ROCURONIUM BROMIDE 100 MG/10ML IV SOLN
INTRAVENOUS | Status: DC | PRN
  Administered 2020-11-15: 30 mg via INTRAVENOUS

## 2020-11-15 MED ORDER — KETAMINE HCL 50 MG/5ML IJ SOSY
PREFILLED_SYRINGE | INTRAMUSCULAR | Status: AC
  Filled 2020-11-15: qty 5

## 2020-11-15 MED ORDER — ORAL CARE MOUTH RINSE: 15.0000 mL | Freq: Once | OROMUCOSAL | Status: AC

## 2020-11-15 MED ORDER — LACTATED RINGERS IV SOLN: INTRAVENOUS | Status: DC

## 2020-11-15 MED ORDER — MIDAZOLAM HCL 2 MG/2ML IJ SOLN
INTRAMUSCULAR | Status: AC
  Filled 2020-11-15: qty 2

## 2020-11-15 MED ORDER — FENTANYL CITRATE (PF) 100 MCG/2ML IJ SOLN
INTRAMUSCULAR | Status: AC
  Filled 2020-11-15: qty 2

## 2020-11-15 MED ORDER — ACETAMINOPHEN 10 MG/ML IV SOLN
INTRAVENOUS | Status: DC | PRN
  Administered 2020-11-15: 1000 mg via INTRAVENOUS

## 2020-11-15 MED ORDER — CHLORHEXIDINE GLUCONATE 0.12 % MT SOLN: 15.0000 mL | Freq: Once | OROMUCOSAL | Status: AC

## 2020-11-15 MED ORDER — PROPOFOL 10 MG/ML IV BOLUS
INTRAVENOUS | Status: AC
  Filled 2020-11-15: qty 20

## 2020-11-15 MED ORDER — PROPOFOL 10 MG/ML IV BOLUS
INTRAVENOUS | Status: DC | PRN
  Administered 2020-11-15: 40 mg via INTRAVENOUS
  Administered 2020-11-15: 120 mg via INTRAVENOUS

## 2020-11-15 MED ORDER — LIDOCAINE HCL (CARDIAC) PF 100 MG/5ML IV SOSY
PREFILLED_SYRINGE | INTRAVENOUS | Status: DC | PRN
  Administered 2020-11-15: 60 mg via INTRAVENOUS

## 2020-11-15 MED ORDER — LIDOCAINE-EPINEPHRINE 1 %-1:100000 IJ SOLN
INTRAMUSCULAR | Status: AC
  Filled 2020-11-15: qty 1

## 2020-11-15 MED ORDER — CHLORHEXIDINE GLUCONATE 0.12 % MT SOLN
OROMUCOSAL | Status: AC
  Administered 2020-11-15: 15 mL via OROMUCOSAL
  Filled 2020-11-15: qty 15

## 2020-11-15 MED ORDER — OXYCODONE HCL 5 MG PO TABS
ORAL_TABLET | ORAL | Status: AC
  Filled 2020-11-15: qty 1

## 2020-11-15 MED ORDER — MEPERIDINE HCL 25 MG/ML IJ SOLN: 6.2500 mg | INTRAMUSCULAR | Status: DC | PRN

## 2020-11-15 MED ORDER — HEMOSTATIC AGENTS (NO CHARGE) OPTIME
TOPICAL | Status: DC | PRN
  Administered 2020-11-15: 1 via TOPICAL

## 2020-11-15 MED ORDER — OXYCODONE HCL 5 MG/5ML PO SOLN: 5.0000 mg | Freq: Once | ORAL | Status: AC | PRN

## 2020-11-15 MED ORDER — FENTANYL CITRATE (PF) 100 MCG/2ML IJ SOLN
INTRAMUSCULAR | Status: DC | PRN
  Administered 2020-11-15: 50 ug via INTRAVENOUS

## 2020-11-15 MED ORDER — PROMETHAZINE HCL 25 MG/ML IJ SOLN: 6.2500 mg | INTRAMUSCULAR | Status: DC | PRN

## 2020-11-15 MED ORDER — SUGAMMADEX SODIUM 200 MG/2ML IV SOLN
INTRAVENOUS | Status: DC | PRN
  Administered 2020-11-15: 125 mg via INTRAVENOUS

## 2020-11-15 MED ORDER — OXYCODONE HCL 5 MG PO TABS
5.0000 mg | ORAL_TABLET | Freq: Once | ORAL | Status: AC | PRN
  Administered 2020-11-15: 5 mg via ORAL

## 2020-11-15 SURGICAL SUPPLY — 50 items
ADH SKN CLS APL DERMABOND .7 (GAUZE/BANDAGES/DRESSINGS) ×1
APPLIER CLIP 11 MED OPEN (CLIP)
APPLIER CLIP 9.375 SM OPEN (CLIP)
APR CLP MED 11 20 MLT OPN (CLIP)
APR CLP SM 9.3 20 MLT OPN (CLIP)
BLADE SURG 15 STRL LF DISP TIS (BLADE) ×1 IMPLANT
BLADE SURG 15 STRL SS (BLADE) ×2
BULB RESERV EVAC DRAIN JP 100C (MISCELLANEOUS) ×2 IMPLANT
CLEANER CAUTERY TIP 5X5 PAD (MISCELLANEOUS) IMPLANT
CLIP APPLIE 11 MED OPEN (CLIP) IMPLANT
CLIP APPLIE 9.375 SM OPEN (CLIP) IMPLANT
CORD BIP STRL DISP 12FT (MISCELLANEOUS) ×2 IMPLANT
DERMABOND ADVANCED (GAUZE/BANDAGES/DRESSINGS) ×1
DERMABOND ADVANCED .7 DNX12 (GAUZE/BANDAGES/DRESSINGS) ×1 IMPLANT
DRAIN CHANNEL JP 10F RND 20C F (MISCELLANEOUS) ×2 IMPLANT
DRAPE MAG INST 16X20 L/F (DRAPES) ×2 IMPLANT
DRSG TEGADERM 2-3/8X2-3/4 SM (GAUZE/BANDAGES/DRESSINGS) ×2 IMPLANT
ELECT CAUTERY BLADE TIP 2.5 (TIP) ×2
ELECT REM PT RETURN 9FT ADLT (ELECTROSURGICAL) ×2
ELECTRODE CAUTERY BLDE TIP 2.5 (TIP) ×1 IMPLANT
ELECTRODE REM PT RTRN 9FT ADLT (ELECTROSURGICAL) ×1 IMPLANT
FORCEPS JEWEL BIP 4-3/4 STR (INSTRUMENTS) ×2 IMPLANT
GAUZE 4X4 16PLY ~~LOC~~+RFID DBL (SPONGE) ×4 IMPLANT
GLOVE SURG ENC MOIS LTX SZ7.5 (GLOVE) ×7 IMPLANT
GOWN STRL REUS W/ TWL LRG LVL3 (GOWN DISPOSABLE) ×3 IMPLANT
GOWN STRL REUS W/TWL LRG LVL3 (GOWN DISPOSABLE) ×6
HEMOSTAT SURGICEL 2X3 (HEMOSTASIS) ×1 IMPLANT
HOOK STAY BLUNT/RETRACTOR 5M (MISCELLANEOUS) IMPLANT
JACKSON PRATT 7MM (INSTRUMENTS) IMPLANT
LABEL OR SOLS (LABEL) ×2 IMPLANT
MANIFOLD NEPTUNE II (INSTRUMENTS) ×2 IMPLANT
NS IRRIG 500ML POUR BTL (IV SOLUTION) ×2 IMPLANT
PACK HEAD/NECK (MISCELLANEOUS) ×2 IMPLANT
PAD CLEANER CAUTERY TIP 5X5 (MISCELLANEOUS) ×1
SHEARS HARMONIC 9CM CVD (BLADE) ×2 IMPLANT
STAPLER SKIN PROX 35W (STAPLE) ×2 IMPLANT
SUCTION FRAZIER HANDLE 10FR (MISCELLANEOUS) ×1
SUCTION TUBE FRAZIER 10FR DISP (MISCELLANEOUS) ×1 IMPLANT
SUT ETHILON 3-0 FS-10 30 BLK (SUTURE) ×2
SUT SILK 0 (SUTURE) ×2
SUT SILK 0 30XBRD TIE 6 (SUTURE) ×1 IMPLANT
SUT SILK 2 0 (SUTURE) ×2
SUT SILK 2 0 SH (SUTURE) ×2 IMPLANT
SUT SILK 2-0 30XBRD TIE 12 (SUTURE) ×1 IMPLANT
SUT SILK 4 0 (SUTURE) ×2
SUT SILK 4-0 18XBRD TIE 12 (SUTURE) ×1 IMPLANT
SUT VIC AB 3-0 SH 27 (SUTURE) ×2
SUT VIC AB 3-0 SH 27X BRD (SUTURE) ×1 IMPLANT
SUT VIC AB 4-0 RB1 18 (SUTURE) ×2 IMPLANT
SUTURE EHLN 3-0 FS-10 30 BLK (SUTURE) ×1 IMPLANT

## 2020-11-15 NOTE — Anesthesia Preprocedure Evaluation (Signed)
Anesthesia Evaluation  Patient identified by MRN, date of birth, ID band Patient awake    Reviewed: Allergy & Precautions, NPO status , Patient's Chart, lab work & pertinent test results  History of Anesthesia Complications Negative for: history of anesthetic complications  Airway Mallampati: III  TM Distance: >3 FB Neck ROM: Full    Dental no notable dental hx.    Pulmonary neg pulmonary ROS, neg sleep apnea, neg COPD,    breath sounds clear to auscultation- rhonchi (-) wheezing      Cardiovascular Exercise Tolerance: Good (-) hypertension(-) CAD, (-) Past MI, (-) Cardiac Stents and (-) CABG  Rhythm:Regular Rate:Normal - Systolic murmurs and - Diastolic murmurs    Neuro/Psych neg Seizures PSYCHIATRIC DISORDERS Anxiety negative neurological ROS     GI/Hepatic negative GI ROS, Neg liver ROS,   Endo/Other  negative endocrine ROSneg diabetes  Renal/GU negative Renal ROS     Musculoskeletal negative musculoskeletal ROS (+)   Abdominal (+) - obese,   Peds  Hematology negative hematology ROS (+)   Anesthesia Other Findings Past Medical History: 04/06/2020: GAD (generalized anxiety disorder) No date: No pertinent past medical history   Reproductive/Obstetrics                             Anesthesia Physical Anesthesia Plan  ASA: 2  Anesthesia Plan: General   Post-op Pain Management:    Induction: Intravenous  PONV Risk Score and Plan: 2 and Ondansetron, Aprepitant, Dexamethasone and Midazolam  Airway Management Planned: Oral ETT  Additional Equipment:   Intra-op Plan:   Post-operative Plan: Extubation in OR  Informed Consent: I have reviewed the patients History and Physical, chart, labs and discussed the procedure including the risks, benefits and alternatives for the proposed anesthesia with the patient or authorized representative who has indicated his/her understanding and  acceptance.     Dental advisory given  Plan Discussed with: CRNA and Anesthesiologist  Anesthesia Plan Comments:         Anesthesia Quick Evaluation

## 2020-11-15 NOTE — Anesthesia Procedure Notes (Signed)
Procedure Name: Intubation Date/Time: 11/15/2020 7:30 AM Performed by: Biagio Borg, CRNA Pre-anesthesia Checklist: Patient identified, Emergency Drugs available, Suction available and Patient being monitored Patient Re-evaluated:Patient Re-evaluated prior to induction Oxygen Delivery Method: Circle system utilized Preoxygenation: Pre-oxygenation with 100% oxygen Induction Type: IV induction Ventilation: Mask ventilation without difficulty Laryngoscope Size: McGraph and 3 Grade View: Grade I Tube type: Oral Tube size: 6.5 mm Number of attempts: 1 Airway Equipment and Method: Stylet Placement Confirmation: ETT inserted through vocal cords under direct vision, positive ETCO2 and breath sounds checked- equal and bilateral Tube secured with: Tape Dental Injury: Teeth and Oropharynx as per pre-operative assessment

## 2020-11-15 NOTE — Discharge Instructions (Signed)
AMBULATORY SURGERY  ?DISCHARGE INSTRUCTIONS ? ? ?The drugs that you were given will stay in your system until tomorrow so for the next 24 hours you should not: ? ?Drive an automobile ?Make any legal decisions ?Drink any alcoholic beverage ? ? ?You may resume regular meals tomorrow.  Today it is better to start with liquids and gradually work up to solid foods. ? ?You may eat anything you prefer, but it is better to start with liquids, then soup and crackers, and gradually work up to solid foods. ? ? ?Please notify your doctor immediately if you have any unusual bleeding, trouble breathing, redness and pain at the surgery site, drainage, fever, or pain not relieved by medication. ? ? ? ?Additional Instructions: ? ? ? ?Please contact your physician with any problems or Same Day Surgery at [phone], Monday through Friday 6 am to 4 pm, or [facility] number at [phone].  ?

## 2020-11-15 NOTE — Anesthesia Postprocedure Evaluation (Signed)
Anesthesia Post Note  Patient: Holly Wilson  Procedure(s) Performed: EXCISION MASS NECK  possible [place] PROCEDURE ANTERIOR NECK MASS  Patient location during evaluation: PACU Anesthesia Type: General Level of consciousness: awake and alert and oriented Pain management: pain level controlled Vital Signs Assessment: post-procedure vital signs reviewed and stable Respiratory status: spontaneous breathing, nonlabored ventilation and respiratory function stable Cardiovascular status: blood pressure returned to baseline and stable Postop Assessment: no signs of nausea or vomiting Anesthetic complications: no   No notable events documented.   Last Vitals:  Vitals:   11/15/20 0930 11/15/20 0947  BP: 106/67 (!) 104/58  Pulse: (!) 101 89  Resp: (!) 24 16  Temp: 36.6 C 36.8 C  SpO2: 98% 100%    Last Pain:  Vitals:   11/15/20 0947  TempSrc: Temporal  PainSc: 5                  Zaydin Billey

## 2020-11-15 NOTE — H&P (Signed)
The patient's history has been reviewed, patient examined, no change in status, stable for surgery.  Questions were answered to the patients satisfaction.  

## 2020-11-15 NOTE — Op Note (Signed)
11/15/2020  9:09 AM    Holly Wilson, Holly Wilson  [id number]   Pre-Op Dx: NECK MASS  Post-op Dx: SAME  Proc: Sistrunk procedure with excision of midline cystic neck mass  Surg:  Roena Malady  Assistant: Vaught  Anes:  GOT  EBL: Less than 20 cc  Comp: None  Findings: 3 to 4 cm cystic anterior neck mass just left of the midline overlying the hyoid bone  Procedure: Holly Wilson was identified in the holding area taken the operating room placed in supine position.  After general endotracheal anesthesia the neck was gently extended.  Obvious midline neck mass was identified just left to the midline overlying the hyoid bone.  Incision line was marked in a natural skin crease in the submental region overlying the cystic mass.  A local anesthetic of 1% lidocaine 1/100000 epinephrine is used to inject over the mass a total of 4 cc was used.  The neck was then prepped and draped sterilely.  A 15 blade was used to incise down to and into the subcutaneous tissues.  Hemostasis achieved using the Bovie cautery.  Subcutaneous fat was divided and immediately the cystic mass could be identified.  This was just to the left of midline.  The capsule was identified and dissected initially inferiorly and extended down to the notch of the thyroid cartilage.  There did not appear to be a ductal structure headed inferiorly and the musculature which was attached to the cystic mass was divided using the harmonic scalpel.  A 2-0 silk tie was placed over the musculature which was separated from the cyst.  The cyst was then dissected on the left lateral aspect there were multiple feeding vessels and fibrous tissue connected to the cystic mass which were divided using the harmonic scalpel.  The cyst was dissected around laterally to the hyoid bone to which it felt adhered.  The dissection then proceeded to the right again the capsule of the cyst was traced down to its attachment to the hyoid bone.  Again several small feeding  vessels were divided using the harmonic scalpel and the bipolar.  The superior aspect of the cyst was gently dissected down to the suprahyoid musculature.  The hyoid bone was then identified on each side of the cyst the microbipolar's were then used to remove the musculature from the superior and inferior aspect of the hyoid bone.  This allowed access posteriorly behind the hyoid bone.  Bone nippers were then used to excise the hyoid bone on each side of the cyst and the microbipolar was used to dissect the cyst and the central portion of the hyoid bone up to the tongue base.  Approximately 3 cm up beyond the hyoid bone we felt this was adequate to excise any possible tract therefore a hemostat was placed across the tract base and the cyst with the center portion of the hyoid bone and the tract were excised.  A 2-0 silk suture ligature was placed across this stone closure.  The specimen was then sent for permanent section.  The wound was copes irrigated with saline the ends of the hyoid bone were oozing were cauterized using the Bovie cautery.  There is no further bleeding.  Surgicel was placed within the wound #7 TLS drain was brought out the wound inferiorly the subcutaneous and platysma layer were closed using 4-0 Vicryl and the skin was closed using Dermabond.  A Tegaderm was used to hold the drain in position.  Patient then turned to anesthesia where she was  awakened in the operating type cart in stable condition.  Specimen: Midline cystic neck mass  Dispo:   Good  Plan: Discharged home with instructions on how to empty the drain she will follow-up in the office tomorrow for drain removal  Roena Malady  11/15/2020 9:09 AM

## 2020-11-15 NOTE — Transfer of Care (Signed)
Immediate Anesthesia Transfer of Care Note  Patient: Holly Wilson  Procedure(s) Performed: EXCISION MASS NECK  possible [place] PROCEDURE ANTERIOR NECK MASS  Patient Location: PACU  Anesthesia Type:General  Level of Consciousness: drowsy  Airway & Oxygen Therapy: Patient Spontanous Breathing and Patient connected to face mask oxygen  Post-op Assessment: Report given to RN and Post -op Vital signs reviewed and stable  Post vital signs: Reviewed and stable  Last Vitals:  Vitals Value Taken Time  BP 115/77 11/15/20 0904  Temp    Pulse 93 11/15/20 0906  Resp 19 11/15/20 0906  SpO2 100 % 11/15/20 0906  Vitals shown include unvalidated device data.  Last Pain:  Vitals:   11/15/20 8403  TempSrc: Oral  PainSc: 0-No pain         Complications: No notable events documented.

## 2020-11-16 LAB — SURGICAL PATHOLOGY

## 2020-12-12 MED ORDER — FLUOXETINE HCL 10 MG PO CAPS: ORAL_CAPSULE | ORAL | 1 refills | Status: AC

## 2021-05-24 IMAGING — CT CT NECK W/ CM
3 of 4 series · 6 of 14 positions shown, 7 images · IV contrast (iopamidol)
Comparison: None.

CLINICAL DATA: Neck mass

EXAM:
CT NECK WITH CONTRAST
TECHNIQUE: Multidetector CT imaging of the neck was performed using the
standard protocol following the bolus administration of intravenous
contrast.
CONTRAST:  75mL [id number] IOPAMIDOL ([id number]) INJECTION 61%

[Series 3: neck · axial · 0.55mm/px · z∈[-243,-161]mm · 2 of 123 slices shown]
[im 41/123  bone]
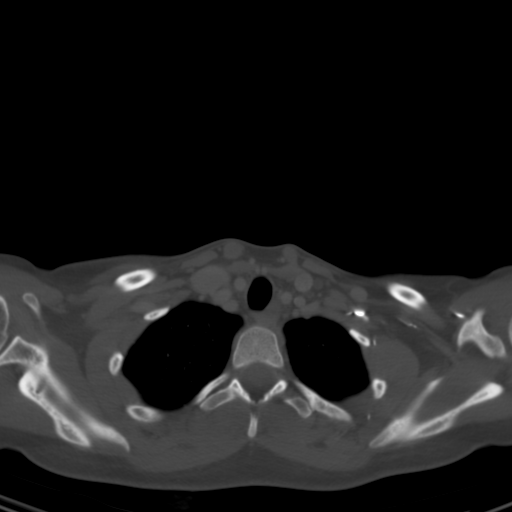
[im 82/123  bone]
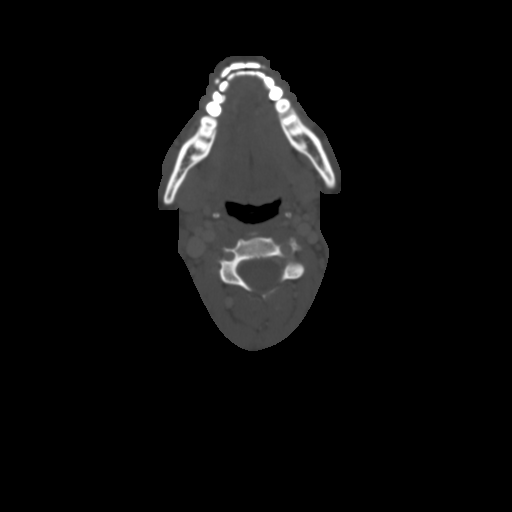

[Series 8: angled axial-oropharynx · axial · 0.53mm/px · z∈[-282,-205]mm · 2 of 124 slices shown, 3 images]
[im 42/124  soft-tissue]
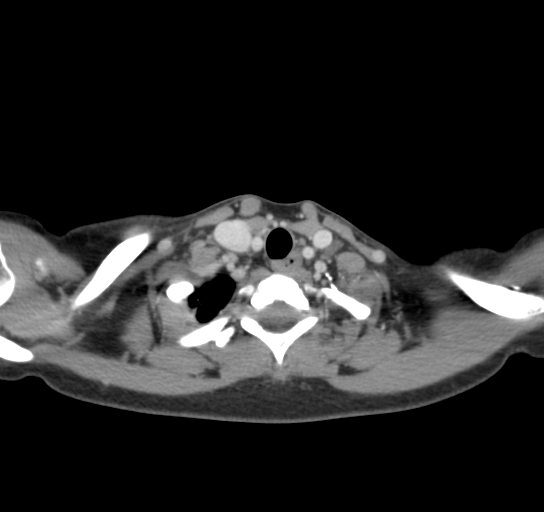
[im 42/124  bone]
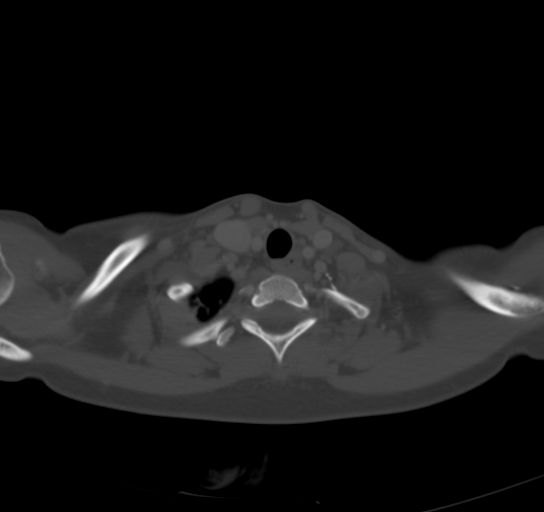
[im 83/124  bone]
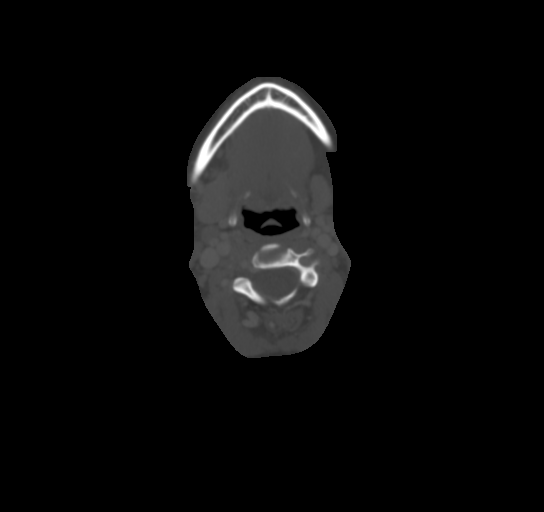

[Series 9: angled (person_name) · axial · 0.53mm/px · z∈[-282,-205]mm · 2 of 124 slices shown]
[im 42/124  bone]
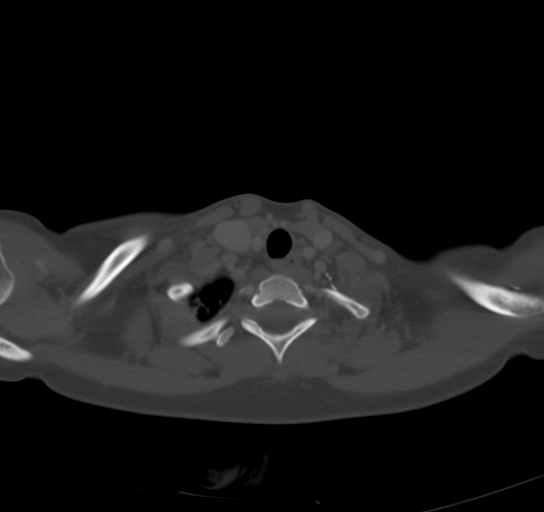
[im 83/124  bone]
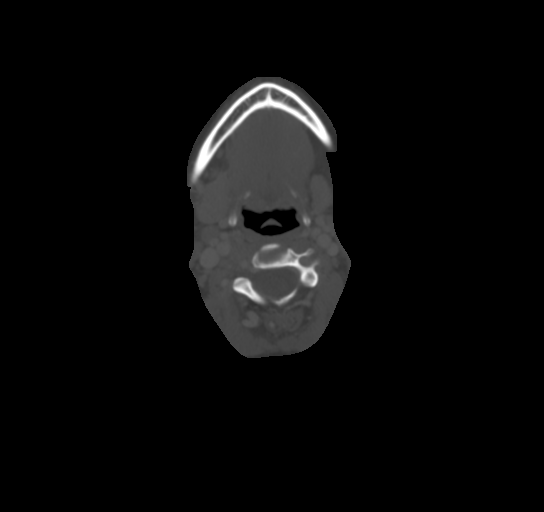

[6 of 14 positions shown; findings below may reference images not displayed]

FINDINGS: Pharynx and larynx: Unremarkable.  No mass or swelling.

Salivary glands: Unremarkable.

Thyroid: Unremarkable.

Lymph nodes: No enlarged or abnormal density nodes.

Vascular: Major neck vessels are patent.

Limited intracranial: No abnormal enhancement.

Visualized orbits: Unremarkable.

Mastoids and visualized paranasal sinuses: No significant
opacification.

Skeleton: No significant abnormality.

Upper chest: Included upper lungs are clear.

Other: Anterior neck skin marker overlies encapsulated fat density
lesion eccentric to the left measuring 1.3 x 2.2 x 1.6 cm. The mass
is anterior to and extends below the level of the hyoid.
IMPRESSION: Palpable mass likely reflects a dermoid cyst. Lipoma is a
differential consideration.
